# Patient Record
Sex: Female | Born: 1935 | Race: Black or African American | Hispanic: No | Marital: Married | Smoking: Never smoker
Health system: Southern US, Community
[De-identification: ages and names within clinical notes are randomized; demographics above are authoritative.]

## PROBLEM LIST (undated history)

## (undated) DIAGNOSIS — B182 Chronic viral hepatitis C: Secondary | ICD-10-CM

## (undated) DIAGNOSIS — E7800 Pure hypercholesterolemia, unspecified: Secondary | ICD-10-CM

## (undated) DIAGNOSIS — I10 Essential (primary) hypertension: Secondary | ICD-10-CM

## (undated) DIAGNOSIS — I509 Heart failure, unspecified: Secondary | ICD-10-CM

## (undated) HISTORY — DX: Heart failure, unspecified: I50.9

## (undated) HISTORY — PX: KNEE SURGERY: SHX244

## (undated) HISTORY — PX: HYSTEROTOMY: SHX1776

---

## 1998-05-10 ENCOUNTER — Ambulatory Visit (HOSPITAL_COMMUNITY): Admission: RE | Admit: 1998-05-10 | Discharge: 1998-05-10 | Payer: Self-pay | Admitting: *Deleted

## 1998-08-01 ENCOUNTER — Ambulatory Visit (HOSPITAL_COMMUNITY): Admission: RE | Admit: 1998-08-01 | Discharge: 1998-08-01 | Payer: Self-pay | Admitting: *Deleted

## 1998-08-01 ENCOUNTER — Encounter: Payer: Self-pay | Admitting: *Deleted

## 1999-01-31 ENCOUNTER — Ambulatory Visit (HOSPITAL_COMMUNITY): Admission: RE | Admit: 1999-01-31 | Discharge: 1999-01-31 | Payer: Self-pay | Admitting: *Deleted

## 1999-02-04 ENCOUNTER — Other Ambulatory Visit: Admission: RE | Admit: 1999-02-04 | Discharge: 1999-02-04 | Payer: Self-pay | Admitting: Obstetrics

## 1999-11-26 ENCOUNTER — Other Ambulatory Visit: Admission: RE | Admit: 1999-11-26 | Discharge: 1999-11-26 | Payer: Self-pay | Admitting: Obstetrics

## 2001-01-18 ENCOUNTER — Encounter (HOSPITAL_BASED_OUTPATIENT_CLINIC_OR_DEPARTMENT_OTHER): Payer: Self-pay | Admitting: Internal Medicine

## 2001-01-18 ENCOUNTER — Ambulatory Visit (HOSPITAL_COMMUNITY): Admission: RE | Admit: 2001-01-18 | Discharge: 2001-01-18 | Payer: Self-pay | Admitting: Internal Medicine

## 2001-01-21 ENCOUNTER — Encounter (HOSPITAL_BASED_OUTPATIENT_CLINIC_OR_DEPARTMENT_OTHER): Payer: Self-pay | Admitting: Internal Medicine

## 2001-01-21 ENCOUNTER — Encounter: Admission: RE | Admit: 2001-01-21 | Discharge: 2001-01-21 | Payer: Self-pay | Admitting: Internal Medicine

## 2001-03-29 ENCOUNTER — Encounter: Admission: RE | Admit: 2001-03-29 | Discharge: 2001-03-29 | Payer: Self-pay | Admitting: *Deleted

## 2001-03-29 ENCOUNTER — Encounter: Payer: Self-pay | Admitting: *Deleted

## 2001-11-22 ENCOUNTER — Encounter (HOSPITAL_BASED_OUTPATIENT_CLINIC_OR_DEPARTMENT_OTHER): Payer: Self-pay | Admitting: Internal Medicine

## 2001-11-22 ENCOUNTER — Encounter: Admission: RE | Admit: 2001-11-22 | Discharge: 2001-11-22 | Payer: Self-pay | Admitting: Internal Medicine

## 2002-01-24 ENCOUNTER — Ambulatory Visit (HOSPITAL_COMMUNITY): Admission: RE | Admit: 2002-01-24 | Discharge: 2002-01-24 | Payer: Self-pay | Admitting: Internal Medicine

## 2002-01-24 ENCOUNTER — Encounter (HOSPITAL_BASED_OUTPATIENT_CLINIC_OR_DEPARTMENT_OTHER): Payer: Self-pay | Admitting: Internal Medicine

## 2003-01-29 ENCOUNTER — Ambulatory Visit (HOSPITAL_COMMUNITY): Admission: RE | Admit: 2003-01-29 | Discharge: 2003-01-29 | Payer: Self-pay | Admitting: Internal Medicine

## 2003-01-29 ENCOUNTER — Encounter (HOSPITAL_BASED_OUTPATIENT_CLINIC_OR_DEPARTMENT_OTHER): Payer: Self-pay | Admitting: Internal Medicine

## 2004-01-30 ENCOUNTER — Ambulatory Visit (HOSPITAL_COMMUNITY): Admission: RE | Admit: 2004-01-30 | Discharge: 2004-01-30 | Payer: Self-pay | Admitting: Internal Medicine

## 2004-08-29 ENCOUNTER — Ambulatory Visit (HOSPITAL_COMMUNITY): Admission: RE | Admit: 2004-08-29 | Discharge: 2004-08-29 | Payer: Self-pay | Admitting: General Surgery

## 2004-08-29 ENCOUNTER — Ambulatory Visit (HOSPITAL_BASED_OUTPATIENT_CLINIC_OR_DEPARTMENT_OTHER): Admission: RE | Admit: 2004-08-29 | Discharge: 2004-08-29 | Payer: Self-pay | Admitting: General Surgery

## 2004-08-29 ENCOUNTER — Encounter (INDEPENDENT_AMBULATORY_CARE_PROVIDER_SITE_OTHER): Payer: Self-pay | Admitting: *Deleted

## 2005-01-30 ENCOUNTER — Ambulatory Visit (HOSPITAL_COMMUNITY): Admission: RE | Admit: 2005-01-30 | Discharge: 2005-01-30 | Payer: Self-pay | Admitting: Internal Medicine

## 2005-08-20 ENCOUNTER — Encounter: Admission: RE | Admit: 2005-08-20 | Discharge: 2005-08-20 | Payer: Self-pay | Admitting: Gastroenterology

## 2006-02-01 ENCOUNTER — Ambulatory Visit (HOSPITAL_COMMUNITY): Admission: RE | Admit: 2006-02-01 | Discharge: 2006-02-01 | Payer: Self-pay | Admitting: Internal Medicine

## 2006-07-30 ENCOUNTER — Ambulatory Visit (HOSPITAL_COMMUNITY): Admission: RE | Admit: 2006-07-30 | Discharge: 2006-07-30 | Payer: Self-pay | Admitting: Gastroenterology

## 2007-02-03 ENCOUNTER — Ambulatory Visit (HOSPITAL_COMMUNITY): Admission: RE | Admit: 2007-02-03 | Discharge: 2007-02-03 | Payer: Self-pay | Admitting: Internal Medicine

## 2008-02-07 ENCOUNTER — Ambulatory Visit (HOSPITAL_COMMUNITY): Admission: RE | Admit: 2008-02-07 | Discharge: 2008-02-07 | Payer: Self-pay | Admitting: Internal Medicine

## 2009-02-07 ENCOUNTER — Ambulatory Visit (HOSPITAL_COMMUNITY): Admission: RE | Admit: 2009-02-07 | Discharge: 2009-02-07 | Payer: Self-pay | Admitting: Internal Medicine

## 2010-02-10 ENCOUNTER — Ambulatory Visit (HOSPITAL_COMMUNITY): Admission: RE | Admit: 2010-02-10 | Discharge: 2010-02-10 | Payer: Self-pay | Admitting: Internal Medicine

## 2011-01-07 ENCOUNTER — Other Ambulatory Visit (HOSPITAL_COMMUNITY): Payer: Self-pay | Admitting: Surgery

## 2011-01-07 ENCOUNTER — Other Ambulatory Visit: Payer: Self-pay | Admitting: Gastroenterology

## 2011-01-07 DIAGNOSIS — Z1231 Encounter for screening mammogram for malignant neoplasm of breast: Secondary | ICD-10-CM

## 2011-02-06 NOTE — Op Note (Signed)
NAMEJATORIA, Caroline Ortiz            ACCOUNT NO.:  [id number]   MEDICAL RECORD NO.:  [id number]          PATIENT TYPE:  AMB   LOCATION:  DSC                          FACILITY:  [facility]   PHYSICIAN:  Anselm Pancoast. Weatherly, M.D.DATE OF BIRTH:  [birth year]   DATE OF PROCEDURE:  DATE OF DISCHARGE:                                 OPERATIVE REPORT   DATE OF OPERATION:  August 29, 2004.   PRE-PROCEDURE DIAGNOSIS:  Large lipoma, left anterior thigh.   POST-PROCEDURE DIAGNOSIS:  Large lipoma, left anterior thigh.   OPERATION:  Incision of lipoma, and it is about five inches or more in  greatest diameter.   ANESTHESIA:  Local anesthesia, minor surgery room.   HISTORY:  [name] is a 77 year old female, who has had a growth in  the left anterior thigh all her life as far as she can remember that has  gradually increased in size so it is now noticeable through her dress.  Her  children advised her to get it removed, and I saw her in the office on  Tuesday.  They desire to do it this week if possible, and I think we can do  it with a local anesthesia with minimal discomfort since it is kind of  easily accessible and appears not to be firmly attached to the surrounding  fatty tissue.   Positioned on the OR table and a Betadine surgical prep, and then the area  where the incision would be was infiltrated with 1% Xylocaine with  adrenaline, and a kind of a field block was used in the lateral aspect where  the nerves come in, all total about 20 mL used.  An incision approximately 2-  1/2 to 3 inches in length was made.  The underlying lipoma was identified,  and this was separated from the surrounding tissue.  The little pedicles  were coming in superiorly and inferiorly, and these were clamped with  hemostats and the sutured with 3-0 chromic after the lipoma was excised.  There was very little subcutaneous tissue over the area, and I just closed  the skin with 3-0 nylon alternating with  simple and mattress sutures.  She  will be released after a short stay.  Antibiotic ointment was placed, and I  instructed her to keep the area dry for about three days, and then we will  see her back in the office next week.  It will probably need to be aspirated  and then plan on removing the sutures the following week.  It will be sent  for routine path exam.  I am sure it is a benign lipoma grossly.       WJW/MEDQ  D:  08/29/2004  T:  08/29/2004  Job:  [id number]

## 2011-02-13 ENCOUNTER — Ambulatory Visit (HOSPITAL_COMMUNITY)
Admission: RE | Admit: 2011-02-13 | Discharge: 2011-02-13 | Disposition: A | Discharge status: Home / Self Care | Payer: Medicare Other | Source: Ambulatory Visit | Attending: Surgery | Admitting: Surgery

## 2011-02-13 DIAGNOSIS — Z1231 Encounter for screening mammogram for malignant neoplasm of breast: Secondary | ICD-10-CM | POA: Insufficient documentation

## 2011-05-24 ENCOUNTER — Inpatient Hospital Stay (INDEPENDENT_AMBULATORY_CARE_PROVIDER_SITE_OTHER)
Admission: RE | Admit: 2011-05-24 | Discharge: 2011-05-24 | Disposition: A | Discharge status: Home / Self Care | Payer: Medicare Other | Source: Ambulatory Visit | Attending: Family Medicine | Admitting: Family Medicine

## 2011-05-24 DIAGNOSIS — T148 Other injury of unspecified body region: Secondary | ICD-10-CM

## 2011-09-02 ENCOUNTER — Other Ambulatory Visit: Payer: Self-pay | Admitting: Internal Medicine

## 2011-09-02 DIAGNOSIS — B182 Chronic viral hepatitis C: Secondary | ICD-10-CM

## 2011-09-04 ENCOUNTER — Ambulatory Visit
Admission: RE | Admit: 2011-09-04 | Discharge: 2011-09-04 | Disposition: A | Discharge status: Home / Self Care | Payer: Medicare Other | Source: Ambulatory Visit | Attending: Internal Medicine | Admitting: Internal Medicine

## 2011-09-04 DIAGNOSIS — B182 Chronic viral hepatitis C: Secondary | ICD-10-CM

## 2011-09-24 ENCOUNTER — Other Ambulatory Visit: Payer: Self-pay | Admitting: Gastroenterology

## 2012-01-27 ENCOUNTER — Other Ambulatory Visit (HOSPITAL_COMMUNITY): Payer: Self-pay | Admitting: Internal Medicine

## 2012-01-27 DIAGNOSIS — Z1231 Encounter for screening mammogram for malignant neoplasm of breast: Secondary | ICD-10-CM

## 2012-02-23 ENCOUNTER — Ambulatory Visit (HOSPITAL_COMMUNITY)
Admission: RE | Admit: 2012-02-23 | Discharge: 2012-02-23 | Disposition: A | Discharge status: Home / Self Care | Payer: Medicare Other | Source: Ambulatory Visit | Attending: Internal Medicine | Admitting: Internal Medicine

## 2012-02-23 DIAGNOSIS — Z1231 Encounter for screening mammogram for malignant neoplasm of breast: Secondary | ICD-10-CM | POA: Insufficient documentation

## 2012-10-07 ENCOUNTER — Other Ambulatory Visit: Payer: Self-pay | Admitting: Internal Medicine

## 2012-10-07 DIAGNOSIS — B182 Chronic viral hepatitis C: Secondary | ICD-10-CM

## 2012-10-10 ENCOUNTER — Ambulatory Visit (HOSPITAL_COMMUNITY)
Admission: RE | Admit: 2012-10-10 | Discharge: 2012-10-10 | Disposition: A | Discharge status: Home / Self Care | Payer: Medicare Other | Source: Ambulatory Visit | Attending: Internal Medicine | Admitting: Internal Medicine

## 2012-10-10 ENCOUNTER — Other Ambulatory Visit: Payer: Self-pay | Admitting: Internal Medicine

## 2012-10-10 DIAGNOSIS — B182 Chronic viral hepatitis C: Secondary | ICD-10-CM | POA: Insufficient documentation

## 2013-01-25 ENCOUNTER — Other Ambulatory Visit (HOSPITAL_COMMUNITY): Payer: Self-pay | Admitting: Internal Medicine

## 2013-01-25 DIAGNOSIS — Z1231 Encounter for screening mammogram for malignant neoplasm of breast: Secondary | ICD-10-CM

## 2013-02-23 ENCOUNTER — Ambulatory Visit (HOSPITAL_COMMUNITY)
Admission: RE | Admit: 2013-02-23 | Discharge: 2013-02-23 | Disposition: A | Discharge status: Home / Self Care | Payer: Medicare Other | Source: Ambulatory Visit | Attending: Internal Medicine | Admitting: Internal Medicine

## 2013-02-23 DIAGNOSIS — Z1231 Encounter for screening mammogram for malignant neoplasm of breast: Secondary | ICD-10-CM | POA: Insufficient documentation

## 2014-01-30 ENCOUNTER — Other Ambulatory Visit (HOSPITAL_COMMUNITY): Payer: Self-pay | Admitting: Internal Medicine

## 2014-01-30 DIAGNOSIS — Z1231 Encounter for screening mammogram for malignant neoplasm of breast: Secondary | ICD-10-CM

## 2014-02-27 ENCOUNTER — Ambulatory Visit (HOSPITAL_COMMUNITY)
Admission: RE | Admit: 2014-02-27 | Discharge: 2014-02-27 | Disposition: A | Discharge status: Home / Self Care | Payer: Medicare Other | Source: Ambulatory Visit | Attending: Internal Medicine | Admitting: Internal Medicine

## 2014-02-27 DIAGNOSIS — Z1231 Encounter for screening mammogram for malignant neoplasm of breast: Secondary | ICD-10-CM | POA: Insufficient documentation

## 2014-04-29 ENCOUNTER — Emergency Department (HOSPITAL_COMMUNITY)
Admission: EM | Admit: 2014-04-29 | Discharge: 2014-04-29 | Disposition: A | Discharge status: Home / Self Care | Payer: Medicare Other | Attending: Emergency Medicine | Admitting: Emergency Medicine

## 2014-04-29 ENCOUNTER — Emergency Department (HOSPITAL_COMMUNITY): Payer: Medicare Other

## 2014-04-29 ENCOUNTER — Encounter (HOSPITAL_COMMUNITY): Payer: Self-pay | Admitting: Emergency Medicine

## 2014-04-29 DIAGNOSIS — E785 Hyperlipidemia, unspecified: Secondary | ICD-10-CM | POA: Diagnosis not present

## 2014-04-29 DIAGNOSIS — N200 Calculus of kidney: Secondary | ICD-10-CM | POA: Diagnosis not present

## 2014-04-29 DIAGNOSIS — Z880 Allergy status to penicillin: Secondary | ICD-10-CM | POA: Insufficient documentation

## 2014-04-29 DIAGNOSIS — Z79899 Other long term (current) drug therapy: Secondary | ICD-10-CM | POA: Insufficient documentation

## 2014-04-29 DIAGNOSIS — R1032 Left lower quadrant pain: Secondary | ICD-10-CM | POA: Diagnosis present

## 2014-04-29 DIAGNOSIS — IMO0002 Reserved for concepts with insufficient information to code with codable children: Secondary | ICD-10-CM | POA: Diagnosis not present

## 2014-04-29 DIAGNOSIS — I10 Essential (primary) hypertension: Secondary | ICD-10-CM | POA: Diagnosis not present

## 2014-04-29 DIAGNOSIS — D696 Thrombocytopenia, unspecified: Secondary | ICD-10-CM | POA: Diagnosis not present

## 2014-04-29 DIAGNOSIS — R112 Nausea with vomiting, unspecified: Secondary | ICD-10-CM | POA: Insufficient documentation

## 2014-04-29 DIAGNOSIS — Z8619 Personal history of other infectious and parasitic diseases: Secondary | ICD-10-CM | POA: Insufficient documentation

## 2014-04-29 HISTORY — DX: Chronic viral hepatitis C: B18.2

## 2014-04-29 HISTORY — DX: Pure hypercholesterolemia, unspecified: E78.00

## 2014-04-29 HISTORY — DX: Essential (primary) hypertension: I10

## 2014-04-29 LAB — CBC WITH DIFFERENTIAL/PLATELET
BASOS ABS: 0 10*3/uL (ref 0.0–0.1)
Basophils Relative: 0 % (ref 0–1)
EOS ABS: 0 10*3/uL (ref 0.0–0.7)
EOS PCT: 0 % (ref 0–5)
HCT: 39.9 % (ref 36.0–46.0)
HEMOGLOBIN: 13.3 g/dL (ref 12.0–15.0)
Lymphocytes Relative: 36 % (ref 12–46)
Lymphs Abs: 1.1 10*3/uL (ref 0.7–4.0)
MCH: 33.3 pg (ref 26.0–34.0)
MCHC: 33.3 g/dL (ref 30.0–36.0)
MCV: 100 fL (ref 78.0–100.0)
MONO ABS: 0.3 10*3/uL (ref 0.1–1.0)
MONOS PCT: 10 % (ref 3–12)
NEUTROS PCT: 53 % (ref 43–77)
Neutro Abs: 1.6 10*3/uL — ABNORMAL LOW (ref 1.7–7.7)
PLATELETS: 65 10*3/uL — AB (ref 150–400)
RBC: 3.99 MIL/uL (ref 3.87–5.11)
RDW: 13.1 % (ref 11.5–15.5)
WBC: 3.1 10*3/uL — ABNORMAL LOW (ref 4.0–10.5)

## 2014-04-29 LAB — URINALYSIS, ROUTINE W REFLEX MICROSCOPIC
Bilirubin Urine: NEGATIVE
GLUCOSE, UA: NEGATIVE mg/dL
KETONES UR: NEGATIVE mg/dL
Leukocytes, UA: NEGATIVE
Nitrite: NEGATIVE
PH: 7 (ref 5.0–8.0)
Protein, ur: NEGATIVE mg/dL
Specific Gravity, Urine: 1.015 (ref 1.005–1.030)
Urobilinogen, UA: 1 mg/dL (ref 0.0–1.0)

## 2014-04-29 LAB — URINE MICROSCOPIC-ADD ON

## 2014-04-29 LAB — I-STAT CHEM 8, ED
BUN: 16 mg/dL (ref 6–23)
CALCIUM ION: 1.16 mmol/L (ref 1.13–1.30)
CHLORIDE: 102 meq/L (ref 96–112)
CREATININE: 0.8 mg/dL (ref 0.50–1.10)
Glucose, Bld: 143 mg/dL — ABNORMAL HIGH (ref 70–99)
HEMATOCRIT: 42 % (ref 36.0–46.0)
Hemoglobin: 14.3 g/dL (ref 12.0–15.0)
Potassium: 3.4 mEq/L — ABNORMAL LOW (ref 3.7–5.3)
SODIUM: 141 meq/L (ref 137–147)
TCO2: 26 mmol/L (ref 0–100)

## 2014-04-29 MED ORDER — IOHEXOL 300 MG/ML  SOLN
25.0000 mL | Freq: Once | INTRAMUSCULAR | Status: AC | PRN
  Administered 2014-04-29: 25 mL via ORAL

## 2014-04-29 MED ORDER — IOHEXOL 300 MG/ML  SOLN
100.0000 mL | Freq: Once | INTRAMUSCULAR | Status: AC | PRN
  Administered 2014-04-29: 100 mL via INTRAVENOUS

## 2014-04-29 MED ORDER — NAPROXEN 500 MG PO TABS: 500.0000 mg | ORAL_TABLET | Freq: Two times a day (BID) | ORAL | Status: DC

## 2014-04-29 MED ORDER — HYDROCODONE-ACETAMINOPHEN 5-325 MG PO TABS: 2.0000 | ORAL_TABLET | ORAL | Status: DC | PRN

## 2014-04-29 MED ORDER — MORPHINE SULFATE 4 MG/ML IJ SOLN
2.0000 mg | Freq: Once | INTRAMUSCULAR | Status: AC
  Administered 2014-04-29: 2 mg via INTRAVENOUS
  Filled 2014-04-29: qty 1

## 2014-04-29 MED ORDER — ONDANSETRON HCL 4 MG/2ML IJ SOLN
4.0000 mg | Freq: Once | INTRAMUSCULAR | Status: AC
  Administered 2014-04-29: 4 mg via INTRAVENOUS
  Filled 2014-04-29: qty 2

## 2014-04-29 NOTE — ED Notes (Signed)
Pt brought to ED by [facility] from home c/o abd pain 10/10 that radiates to the groin on the left side, pain increases when pt make urine. Pt having some nausea and vomiting on EMS arrival. Zofran IV given by EMS.

## 2014-04-29 NOTE — ED Provider Notes (Signed)
CSN: [id number]     Arrival date & time 04/29/14  0236 History   First MD Initiated Contact with Patient 04/29/14 [phone]     Chief Complaint  Patient presents with  . Abdominal Pain  . Nausea  . Emesis     (Consider location/radiation/quality/duration/timing/severity/associated sxs/prior Treatment) HPI Comments: 80 year old female, history of hypertension and high cholesterol as well as a history of a hysterectomy in the past who presents with a complaint of left lower quadrant pain. This started this evening, it is intermittent, worse with trying to urinate, no diarrhea or constipation. She did have some nausea and vomiting when the paramedics arrived, they gave Zofran. She states that this pain is severe at its worst, it is intermittent, it does radiate to her left flank and groin.  Patient is a 80 y.o. female presenting with abdominal pain and vomiting. The history is provided by the patient.  Abdominal Pain Associated symptoms: vomiting   Emesis Associated symptoms: abdominal pain     Past Medical History  Diagnosis Date  . Hypertension   . High cholesterol   . Hep C w/ coma, chronic    History reviewed. No pertinent past surgical history. History reviewed. No pertinent family history. History  Substance Use Topics  . Smoking status: Never Smoker   . Smokeless tobacco: Never Used  . Alcohol Use: No   OB History   Grav Para Term Preterm Abortions TAB SAB Ect Mult Living                 Review of Systems  Gastrointestinal: Positive for vomiting and abdominal pain.  All other systems reviewed and are negative.     Allergies  Penicillins  Home Medications   Prior to Admission medications   Medication Sig Start Date End Date Taking? Authorizing Provider  amLODipine (NORVASC) 2.5 MG tablet Take 2.5 mg by mouth daily.   Yes Historical Provider, MD  calcium carbonate (OS-CAL) 600 MG TABS tablet Take 600 mg by mouth daily with breakfast.   Yes Historical Provider, MD   cholecalciferol (VITAMIN D) 1000 UNITS tablet Take 1,000 Units by mouth daily.   Yes Historical Provider, MD  gabapentin (NEURONTIN) 100 MG capsule Take 100 mg by mouth 2 (two) times daily.    Yes Historical Provider, MD  lisinopril-hydrochlorothiazide (PRINZIDE,ZESTORETIC) 10-12.5 MG per tablet Take 1 tablet by mouth daily.   Yes Historical Provider, MD  metoprolol (LOPRESSOR) 50 MG tablet Take 50 mg by mouth daily.   Yes Historical Provider, MD  PRESCRIPTION MEDICATION Take 1 tablet by mouth daily as needed (anxiety). Anxiety medication but did not have the bottle with her   Yes Historical Provider, MD  HYDROcodone-acetaminophen (NORCO/VICODIN) 5-325 MG per tablet Take 2 tablets by mouth every 4 (four) hours as needed. 04/29/14   Vida Roller, MD  naproxen (NAPROSYN) 500 MG tablet Take 1 tablet (500 mg total) by mouth 2 (two) times daily with a meal. 04/29/14   Vida Roller, MD   BP 118/59  Pulse 65  Temp(Src) 97.8 F (36.6 C) (Oral)  Resp 15  Ht 5\' 4"  (1.626 m)  Wt 134 lb (60.782 kg)  BMI 22.99 kg/m2  SpO2 97% Physical Exam  Nursing note and vitals reviewed. Constitutional: She appears well-developed and well-nourished. No distress.  HENT:  Head: Normocephalic and atraumatic.  Mouth/Throat: Oropharynx is clear and moist. No oropharyngeal exudate.  Eyes: Conjunctivae and EOM are normal. Pupils are equal, round, and reactive to light. Right eye exhibits no discharge.  Left eye exhibits no discharge. No scleral icterus.  Neck: Normal range of motion. Neck supple. No JVD present. No thyromegaly present.  Cardiovascular: Normal rate, regular rhythm, normal heart sounds and intact distal pulses.  Exam reveals no gallop and no friction rub.   No murmur heard. Pulmonary/Chest: Effort normal and breath sounds normal. No respiratory distress. She has no wheezes. She has no rales.  Abdominal: Soft. Bowel sounds are normal. She exhibits no distension and no mass. There is tenderness (  Reproducible left lower quadrant tenderness to palpation, no pain at McBurney's point, no Murphy sign).  No guarding, no distention, normal bowel sounds  Musculoskeletal: Normal range of motion. She exhibits no edema and no tenderness.  Lymphadenopathy:    She has no cervical adenopathy.  Neurological: She is alert. Coordination normal.  Skin: Skin is warm and dry. No rash noted. No erythema.  Psychiatric: She has a normal mood and affect. Her behavior is normal.    ED Course  Procedures (including critical care time) Labs Review Labs Reviewed  URINALYSIS, ROUTINE W REFLEX MICROSCOPIC - Abnormal; Notable for the following:    Hgb urine dipstick MODERATE (*)    All other components within normal limits  CBC WITH DIFFERENTIAL - Abnormal; Notable for the following:    WBC 3.1 (*)    Platelets 65 (*)    Neutro Abs 1.6 (*)    All other components within normal limits  URINE MICROSCOPIC-ADD ON - Abnormal; Notable for the following:    Squamous Epithelial / LPF FEW (*)    Bacteria, UA FEW (*)    All other components within normal limits  I-STAT CHEM 8, ED - Abnormal; Notable for the following:    Potassium 3.4 (*)    Glucose, Bld 143 (*)    All other components within normal limits  URINE CULTURE    Imaging Review Ct Abdomen Pelvis W Contrast  04/29/2014   CLINICAL DATA:  Abdominal pain, radiating to the left groin. Dysuria. Nausea and vomiting.  EXAM: CT ABDOMEN AND PELVIS WITH CONTRAST  TECHNIQUE: Multidetector CT imaging of the abdomen and pelvis was performed using the standard protocol following bolus administration of intravenous contrast.  CONTRAST:  OMNIPAQUE IOHEXOL 300 MG/ML  SOLN  COMPARISON:  None.  FINDINGS: Mild bibasilar atelectasis or scarring is noted. Dense calcification is noted at the mitral valve. A tiny hiatal hernia is seen.  The liver and spleen are unremarkable in appearance. The gallbladder is within normal limits. The pancreas and adrenal glands are  unremarkable.  There is minimal left-sided hydronephrosis, with mild prominence of the left ureter along its entire course. Mild wall thickening along the left ureter may reflect mild ureteritis. No definite distal obstructing stone is seen; this may reflect a recently passed stone.  The right kidney is unremarkable in appearance. A 2.1 cm cyst is noted near the upper pole of the left kidney; smaller bilateral renal cysts are seen. No significant perinephric stranding is appreciated. No nonobstructing renal stones are identified.  No free fluid is identified. The small bowel is unremarkable in appearance. The stomach is within normal limits. No acute vascular abnormalities are seen.  The appendix is not typically seen; there is no evidence for appendicitis. There is mild redundancy of the transverse colon. Scattered diverticulosis is noted along the transverse, descending and proximal sigmoid colon, without evidence of diverticulitis. The sigmoid colon is relatively decompressed and difficult to fully assess.  The bladder is moderately distended and grossly unremarkable. The  patient is status post hysterectomy. No suspicious adnexal masses are seen. The ovaries are relatively symmetric. No inguinal lymphadenopathy is seen.  No acute osseous abnormalities are identified. There is grade 1 anterolisthesis of L4 on L5, reflecting underlying facet disease. Vacuum phenomenon is noted at multiple levels along the lumbar spine.  IMPRESSION: 1. Minimal left-sided hydronephrosis, with mild diffuse prominence of the left ureter. Mild wall thickening along the left ureter may reflect mild ureteritis. No definite distal obstructing stone seen; this may reflect a recently passed stone. No evidence of pyelonephritis at this time. 2. Scattered small bilateral renal cysts. 3. Scattered diverticulosis along the transverse, descending and proximal sigmoid colon, without evidence of diverticulitis. 4. Dense calcification of the mitral  valve. 5. Tiny hiatal hernia seen. 6. Mild atelectasis or scarring noted at the lung bases.   Electronically Signed   By: Roanna RaiderJeffery  Chang M.D.   On: 04/29/2014 05:48      MDM   Final diagnoses:  Kidney stone on left side  Thrombocytopenia    The patient appears to have focal left lower quadrant pain, dysuria, consider urinary infection, would also consider diverticulitis or other intra-abdominal source it urinalysis not consistent with infection.  Urinalysis shows that the patient has hematuria, CT scan confirms ureteral dilation consistent with a recently passed kidney stone. The patient has received medication and has been asymptomatic for the majority of her stay, will be discharged home in an improved condition, family informed of the diagnosis. In addition the patient is thrombocytopenic, I do not laboratory data with which to compare. Family informed of this as well    Vida RollerBrian D Aliese Brannum, MD 04/29/14 [phone]

## 2014-04-29 NOTE — Discharge Instructions (Signed)
Your exam and or your xrays have shown that you likely have a kidney stone.  You should follow up with the Urologist of your choosing or the Urologist listed above in the next 2-3 days if you have not passed the stone.  You should urinate in to the strainer until you pass the stone.    Flomax helps with passing the stone by opening up the Ureters (tubes), Vicodin and an antiinflammatory for pain if you are not allergic to these medicines.  Phenergan or Zofran for nausea.  Return to the ER for severe or worsening pain, vomiting or fevers or if you are unable to control your pain with the medicines provided.  Kidney Stones Kidney stones (ureteral lithiasis) are deposits that form inside your kidneys. The intense pain is caused by the stone moving through the urinary tract. When the stone moves, the ureter goes into spasm around the stone. The stone is usually passed in the urine.  CAUSES  A disorder that makes certain neck glands produce too much parathyroid hormone (primary hyperparathyroidism).  A buildup of uric acid crystals.  Narrowing (stricture) of the ureter.  A kidney obstruction present at birth (congenital obstruction).  Previous surgery on the kidney or ureters.  Numerous kidney infections.  SYMPTOMS  Feeling sick to your stomach (nauseous).  Throwing up (vomiting).  Blood in the urine (hematuria).  Pain that usually spreads (radiates) to the groin.  Frequency or urgency of urination.  DIAGNOSIS  Taking a history and physical exam.  Blood or urine tests.  Computerized X-ray scan (CT scan).  Occasionally, an examination of the inside of the urinary bladder (cystoscopy) is performed.  TREATMENT  Observation.  Increasing your fluid intake.  Surgery may be needed if you have severe pain or persistent obstruction.  The size, location, and chemical composition are all important variables that will determine the proper choice of action for you. Talk to your caregiver to better  understand your situation so that you will minimize the risk of injury to yourself and your kidney.  HOME CARE INSTRUCTIONS  Drink enough water and fluids to keep your urine clear or pale yellow.  Strain all urine through the provided strainer. Keep all particulate matter and stones for your caregiver to see. The stone causing the pain may be as small as a grain of salt. It is very important to use the strainer each and every time you pass your urine. The collection of your stone will allow your caregiver to analyze it and verify that a stone has actually passed.  Only take over-the-counter or prescription medicines for pain, discomfort, or fever as directed by your caregiver.  Make a follow-up appointment with your caregiver as directed.  Get follow-up X-rays if required. The absence of pain does not always mean that the stone has passed. It may have only stopped moving. If the urine remains completely obstructed, it can cause loss of kidney function or even complete destruction of the kidney. It is your responsibility to make sure X-rays and follow-ups are completed. Ultrasounds of the kidney can show blockages and the status of the kidney. Ultrasounds are not associated with any radiation and can be performed easily in a matter of minutes.  SEEK IMMEDIATE MEDICAL CARE IF:  Pain cannot be controlled with the prescribed medicine.  You have a fever.  The severity or intensity of pain increases over 18 hours and is not relieved by pain medicine.  You develop a new onset of abdominal pain.  You   feel faint or pass out.  MAKE SURE YOU:  Understand these instructions.  Will watch your condition.  Will get help right away if you are not doing well or get worse.  Document Released: 09/07/2005 Document Revised: 08/27/2011 Document Reviewed: 01/03/2010 ExitCare Patient Information 2012 [employer]  RESOURCE GUIDE  Chronic Pain Problems: Contact [facility]  [phone] Patients  need to be referred by their primary care doctor.  Insufficient Money for Medicine: Contact United Way:  call "211" or [facility] 271-5999.  No Primary Care Doctor: Call [facility]  832-8000 - can help you locate a primary care doctor that  accepts your insurance, provides certain services, etc. Physician Referral Service- 1-800-533-3463  Agencies that provide inexpensive medical care: [facility]  [phone] [facility]  [phone] [facility]  [phone] [facility]  [phone] [facility]  [phone] [facility]  [phone]  Medicaid-accepting [place] Providers: [facility]- [address]  [phone], Mon-Fri 9am-7pm, Sat 9am-1pm [facility]- [address], [address]  [phone] [facility]- [address], [address]  [phone] [facility]- [address] [place] [address]  [phone] [facility]- [address], [address], [phone]  Only accepts [place] Access Medicaid patients after they have their name  applied to their card  Self Pay (no insurance) in [place]: Sickle Cell Patients: Dr Eric Dean, [facility]  [address], [phone] [facility]- [address]  [phone]       -     [facility]- [address]       -     [facility]- see information above (Speak to Pam H if you do not have insurance)       -  [facility]- [address], [phone]       -  [facility] [place]- [address],  [phone]       -  [facility]- [address] [place] [address], [phone]       -  Dr Osei-Bonsu-  [address], [place], [phone]       -  [facility]- [address], [phone]       -  [facility]- [address] [place] [address], [phone], also [address], [phone]        -    [facility]- [address], [phone], 1st & 3rd Saturday   every month, 10am-1pm  1) Find a Doctor and Pay Out of Pocket Although you won't have to find out who is covered by your insurance plan, it is a good idea to ask around and get recommendations. You will then need to call the office and see if the doctor you have chosen will accept you as a new patient and what types of options they offer for patients who are self-pay. Some doctors offer discounts or will set up payment plans for their patients who do not have insurance, but you will need to ask so you aren't surprised when you get to your appointment.  2) Contact Your Local Health Department Not all health departments have doctors that can see patients for sick visits, but many do, so it is worth a call to see if yours does. If   you don't know where your local health department is, you can check in your phone book. The CDC also has a tool to help you locate your state's health department, and many state websites also have listings of all of their local health departments.  3) Find a Walk-in Clinic If your illness is not likely to be very severe or complicated, you may want to try a walk in clinic. These are popping up all over the country in pharmacies, drugstores, and shopping centers. They're usually staffed by nurse practitioners or physician assistants that have been trained to treat common illnesses and complaints. They're usually fairly quick and inexpensive. However, if you have serious medical issues or chronic medical problems, these are probably not your best option  STD Testing [place] [facility] [place], [facility] Clinic, [address], [place], phone [phone] or [phone].  Monday - Friday, call for an appointment. [place] [facility] [place], [facility] Clinic, [address], [place], phone [phone] or [phone].  Monday - Friday, call for an  appointment.  Abuse/Neglect: [place] Child Abuse Hotline (336) 641-3795 [place] Child Abuse Hotline 800-378-5315 (After Hours)  Emergency Shelter:  [facility] (336) 271-5985  Maternity Homes: [facility] (336) 275-9566 [facility] (704) 372-4663  MRSA Hotline #:   832-7006  [place] Resources  [facility] [place]  [employer] [place] [facility] [address]                 [address]         [address]  [place]                                               [place]                              [place] Phone:  [phone]                                  Phone:  [phone]                   Phone:  [phone]  [place] [facility], 342-8316 [place] Services - [facility]- 1-888-581-9988       -     [facility] in [place], [address],                                  336-349-4454, Insurance  [place] Child Abuse Hotline (336) 342-1394 or (336) 342-3537 (After Hours)   Behavioral Health Services  Substance Abuse Resources: [facility]  336-882-2125 [facility] 336-784-9470 [facility] 336-285-9073 [facility] 336-845-3988 Residential & Outpatient Substance Abuse Program  800-659-3381  Psychological Services: [facility]  832-9600 [facility]  [phone] [facility], [address], [place], ACCESS LINE: 1-800-853-5163 or 336-641-4981, Http://www.[facility].com/services/adult.htm  Dental Assistance  If unable to pay or uninsured, contact:  [facility] or [place] [facility] to become qualified for the adult dental clinic.  Patients   with Medicaid: [facility] [address], [phone], [phone]  If unable to pay, or uninsured, contact  [facility] (271-5999) or [place] [facility] ([phone] in [place], [phone] in [place]) to become qualified for the adult dental clinic  Other Low-Cost Community Dental Services: [facility]- [address], [place], [place], [address], [phone], [address]. [address], 2nd and 4th Thursday of the month at 6:30am.  10 clients each day by appointment, can sometimes see walk-in patients if someone does not show for an appointment. [facility]- [address], [place], [place], [address], [phone] [facility]- [address], [place], [place], [address], [phone] [place] [facility]- [phone] [facility]- [phone] [facility]- [phone]      

## 2014-04-30 LAB — URINE CULTURE
COLONY COUNT: NO GROWTH
Culture: NO GROWTH

## 2015-02-04 ENCOUNTER — Other Ambulatory Visit (HOSPITAL_COMMUNITY): Payer: Self-pay | Admitting: Internal Medicine

## 2015-02-04 DIAGNOSIS — Z1231 Encounter for screening mammogram for malignant neoplasm of breast: Secondary | ICD-10-CM

## 2015-03-01 ENCOUNTER — Ambulatory Visit (HOSPITAL_COMMUNITY)
Admission: RE | Admit: 2015-03-01 | Discharge: 2015-03-01 | Disposition: A | Discharge status: Home / Self Care | Payer: Medicare Other | Source: Ambulatory Visit | Attending: Internal Medicine | Admitting: Internal Medicine

## 2015-03-01 DIAGNOSIS — Z1231 Encounter for screening mammogram for malignant neoplasm of breast: Secondary | ICD-10-CM | POA: Diagnosis not present

## 2015-10-24 ENCOUNTER — Other Ambulatory Visit (HOSPITAL_COMMUNITY): Payer: Self-pay | Admitting: *Deleted

## 2015-10-25 ENCOUNTER — Ambulatory Visit (HOSPITAL_COMMUNITY)
Admission: RE | Admit: 2015-10-25 | Discharge: 2015-10-25 | Disposition: A | Discharge status: Home / Self Care | Payer: Medicare Other | Source: Ambulatory Visit | Attending: Internal Medicine | Admitting: Internal Medicine

## 2015-10-25 DIAGNOSIS — M810 Age-related osteoporosis without current pathological fracture: Secondary | ICD-10-CM | POA: Insufficient documentation

## 2015-10-25 MED ORDER — DENOSUMAB 60 MG/ML ~~LOC~~ SOLN
60.0000 mg | Freq: Once | SUBCUTANEOUS | Status: AC
  Administered 2015-10-25: 60 mg via SUBCUTANEOUS
  Filled 2015-10-25: qty 1

## 2016-01-16 ENCOUNTER — Other Ambulatory Visit (HOSPITAL_COMMUNITY): Payer: Self-pay | Admitting: Gastroenterology

## 2016-01-16 DIAGNOSIS — B182 Chronic viral hepatitis C: Secondary | ICD-10-CM

## 2016-01-27 ENCOUNTER — Other Ambulatory Visit: Payer: Self-pay

## 2016-01-27 DIAGNOSIS — Z1231 Encounter for screening mammogram for malignant neoplasm of breast: Secondary | ICD-10-CM

## 2016-02-12 ENCOUNTER — Ambulatory Visit (HOSPITAL_COMMUNITY)
Admission: RE | Admit: 2016-02-12 | Discharge: 2016-02-12 | Disposition: A | Discharge status: Home / Self Care | Payer: Medicare Other | Source: Ambulatory Visit | Attending: Gastroenterology | Admitting: Gastroenterology

## 2016-02-12 DIAGNOSIS — B182 Chronic viral hepatitis C: Secondary | ICD-10-CM

## 2016-02-20 ENCOUNTER — Ambulatory Visit (HOSPITAL_COMMUNITY): Payer: Medicare Other

## 2016-03-03 ENCOUNTER — Ambulatory Visit
Admission: RE | Admit: 2016-03-03 | Discharge: 2016-03-03 | Disposition: A | Discharge status: Home / Self Care | Payer: Medicare Other | Source: Ambulatory Visit

## 2016-03-03 DIAGNOSIS — Z1231 Encounter for screening mammogram for malignant neoplasm of breast: Secondary | ICD-10-CM

## 2016-03-05 ENCOUNTER — Other Ambulatory Visit: Payer: Self-pay | Admitting: Internal Medicine

## 2016-03-05 DIAGNOSIS — R928 Other abnormal and inconclusive findings on diagnostic imaging of breast: Secondary | ICD-10-CM

## 2016-03-16 ENCOUNTER — Ambulatory Visit
Admission: RE | Admit: 2016-03-16 | Discharge: 2016-03-16 | Disposition: A | Discharge status: Home / Self Care | Payer: Medicare Other | Source: Ambulatory Visit | Attending: Internal Medicine | Admitting: Internal Medicine

## 2016-03-16 DIAGNOSIS — R928 Other abnormal and inconclusive findings on diagnostic imaging of breast: Secondary | ICD-10-CM

## 2016-03-17 ENCOUNTER — Ambulatory Visit (HOSPITAL_COMMUNITY): Payer: Medicare Other

## 2016-03-18 ENCOUNTER — Ambulatory Visit (HOSPITAL_COMMUNITY)
Admission: RE | Admit: 2016-03-18 | Discharge: 2016-03-18 | Disposition: A | Discharge status: Home / Self Care | Payer: Medicare Other | Source: Ambulatory Visit | Attending: Gastroenterology | Admitting: Gastroenterology

## 2016-03-18 DIAGNOSIS — N281 Cyst of kidney, acquired: Secondary | ICD-10-CM | POA: Insufficient documentation

## 2016-03-18 DIAGNOSIS — K7460 Unspecified cirrhosis of liver: Secondary | ICD-10-CM | POA: Diagnosis not present

## 2016-03-18 DIAGNOSIS — B182 Chronic viral hepatitis C: Secondary | ICD-10-CM | POA: Diagnosis present

## 2016-04-18 ENCOUNTER — Emergency Department (HOSPITAL_COMMUNITY): Payer: Medicare Other

## 2016-04-18 ENCOUNTER — Emergency Department (HOSPITAL_COMMUNITY)
Admission: EM | Admit: 2016-04-18 | Discharge: 2016-04-18 | Disposition: A | Discharge status: Home / Self Care | Payer: Medicare Other | Attending: Emergency Medicine | Admitting: Emergency Medicine

## 2016-04-18 ENCOUNTER — Encounter (HOSPITAL_COMMUNITY): Payer: Self-pay

## 2016-04-18 DIAGNOSIS — S0083XA Contusion of other part of head, initial encounter: Secondary | ICD-10-CM | POA: Diagnosis not present

## 2016-04-18 DIAGNOSIS — W01198A Fall on same level from slipping, tripping and stumbling with subsequent striking against other object, initial encounter: Secondary | ICD-10-CM | POA: Diagnosis not present

## 2016-04-18 DIAGNOSIS — M25562 Pain in left knee: Secondary | ICD-10-CM | POA: Diagnosis not present

## 2016-04-18 DIAGNOSIS — R51 Headache: Secondary | ICD-10-CM | POA: Diagnosis not present

## 2016-04-18 DIAGNOSIS — I10 Essential (primary) hypertension: Secondary | ICD-10-CM | POA: Diagnosis not present

## 2016-04-18 DIAGNOSIS — Z79899 Other long term (current) drug therapy: Secondary | ICD-10-CM | POA: Insufficient documentation

## 2016-04-18 DIAGNOSIS — M25561 Pain in right knee: Secondary | ICD-10-CM | POA: Insufficient documentation

## 2016-04-18 DIAGNOSIS — W19XXXA Unspecified fall, initial encounter: Secondary | ICD-10-CM

## 2016-04-18 DIAGNOSIS — S299XXA Unspecified injury of thorax, initial encounter: Secondary | ICD-10-CM | POA: Diagnosis not present

## 2016-04-18 DIAGNOSIS — Y999 Unspecified external cause status: Secondary | ICD-10-CM | POA: Insufficient documentation

## 2016-04-18 DIAGNOSIS — S0993XA Unspecified injury of face, initial encounter: Secondary | ICD-10-CM | POA: Diagnosis present

## 2016-04-18 DIAGNOSIS — Y92513 Shop (commercial) as the place of occurrence of the external cause: Secondary | ICD-10-CM | POA: Insufficient documentation

## 2016-04-18 DIAGNOSIS — Y9301 Activity, walking, marching and hiking: Secondary | ICD-10-CM | POA: Diagnosis not present

## 2016-04-18 MED ORDER — ACETAMINOPHEN 325 MG PO TABS
650.0000 mg | ORAL_TABLET | Freq: Once | ORAL | Status: AC
  Administered 2016-04-18: 650 mg via ORAL
  Filled 2016-04-18: qty 2

## 2016-04-18 NOTE — ED Triage Notes (Signed)
Patient fell while walking today at shopping center after stepping into crack of sidewalk. Patient denies loc but complains of bilateral knee and hand pain and right side of face pain. No swelling or abrasions noted

## 2016-04-18 NOTE — ED Provider Notes (Signed)
[facility]EMER[facility]ENCY DEPT Provider Note   CSN: [id number] Arrival date & time: 04/18/16  1117  First Provider Contact:  First MD Initiated Contact with Patient 04/18/16 1454        History   Chief Complaint No chief complaint on file.   HPI Caroline Ortiz is a 82 y.o. female presenting after a trip and fall. She was walking with family and hit an uneven area on the sidewalk and fell forward. Hit her chin and family thinks she might hit her face but patient denies this. She did not lose consciousness. She also hit her hands and knees. Denies any type of headache and heard jaw pain is improving. She feels a little numb around her inferior right lip. Her hands have some mild pain in her kneecaps also hurt a little bit. However she has been walking around in the waiting room while waiting on her room to prevent her from getting stiff. She currently has ice on her knees. No hip pain. She does have some chest wall pain in the middle of her chest. No dyspnea. Family states she has hep c but no cirrhosis. Bruises easily. Is on a baby ASA.  HPI  Past Medical History:  Diagnosis Date  . Hep C w/ coma, chronic (HCC)   . High cholesterol   . Hypertension     There are no active problems to display for this patient.   History reviewed. No pertinent surgical history.  OB History    No data available       Home Medications    Prior to Admission medications   Medication Sig Start Date End Date Taking? Authorizing Provider  amLODipine (NORVASC) 2.5 MG tablet Take 2.5 mg by mouth daily.    Historical Provider, MD  calcium carbonate (OS-CAL) 600 MG TABS tablet Take 600 mg by mouth daily with breakfast.    Historical Provider, MD  cholecalciferol (VITAMIN D) 1000 UNITS tablet Take 1,000 Units by mouth daily.    Historical Provider, MD  gabapentin (NEURONTIN) 100 MG capsule Take 100 mg by mouth 2 (two) times daily.     Historical Provider, MD  HYDROcodone-acetaminophen (NORCO/VICODIN) 5-325 MG  per tablet Take 2 tablets by mouth every 4 (four) hours as needed. 04/29/14   Eber Hong, MD  lisinopril-hydrochlorothiazide (PRINZIDE,ZESTORETIC) 10-12.5 MG per tablet Take 1 tablet by mouth daily.    Historical Provider, MD  metoprolol (LOPRESSOR) 50 MG tablet Take 50 mg by mouth daily.    Historical Provider, MD  naproxen (NAPROSYN) 500 MG tablet Take 1 tablet (500 mg total) by mouth 2 (two) times daily with a meal. 04/29/14   Eber Hong, MD  PRESCRIPTION MEDICATION Take 1 tablet by mouth daily as needed (anxiety). Anxiety medication but did not have the bottle with her    Historical Provider, MD    Family History No family history on file.  Social History Social History  Substance Use Topics  . Smoking status: Never Smoker  . Smokeless tobacco: Never Used  . Alcohol use No     Allergies   Penicillins   Review of Systems Review of Systems  HENT: Positive for facial swelling.   Gastrointestinal: Negative for nausea and vomiting.  Musculoskeletal: Positive for arthralgias and neck stiffness. Negative for joint swelling and neck pain.  Neurological: Negative for dizziness, weakness, numbness and headaches.  All other systems reviewed and are negative.    Physical Exam Updated Vital Signs BP 136/71 (BP Location: Right Arm)   Pulse (!) 58  Temp 98.3 F (36.8 C) (Oral)   Resp 18   Ht  (1.676 m)   Wt 132 lb (59.9 kg)   SpO2 97%   BMI 21.31 kg/m   Physical Exam  Constitutional: She is oriented to person, place, and time. She appears well-developed and well-nourished. No distress.  HENT:  Head: Normocephalic.    Right Ear: External ear normal.  Left Ear: External ear normal.  Nose: Nose normal.  Eyes: EOM are normal. Pupils are equal, round, and reactive to light. Right eye exhibits no discharge. Left eye exhibits no discharge.  Neck: Normal range of motion. Neck supple.  Some right lateral neck tenderness with ROM but no midline or paraspinal tenderness or  decreased ROM  Cardiovascular: Normal rate, regular rhythm and normal heart sounds.   Pulmonary/Chest: Effort normal and breath sounds normal. She exhibits tenderness (mild).    Abdominal: Soft. She exhibits no distension. There is no tenderness.  Musculoskeletal:       Right hip: She exhibits normal range of motion and no tenderness.       Left hip: She exhibits normal range of motion and no tenderness.       Right knee: She exhibits normal range of motion and no swelling. Tenderness (mild over patella) found.       Left knee: She exhibits normal range of motion and no swelling. Tenderness (mild, over patella) found.       Right hand: She exhibits normal range of motion, no tenderness and no swelling.       Left hand: She exhibits normal range of motion, no tenderness and no swelling.  Neurological: She is alert and oriented to person, place, and time.  CN 3-12 grossly intact. 5/5 strength in all 4 extremities. Grossly normal sensation.   Skin: Skin is warm and dry. She is not diaphoretic.  Nursing note and vitals reviewed.    ED Treatments / Results  Labs (all labs ordered are listed, but only abnormal results are displayed) Labs Reviewed - No data to display  EKG  EKG Interpretation None       Radiology Dg Chest 2 View  Result Date: 04/18/2016 CLINICAL DATA:  Fall today with chest pain, initial encounter EXAM: CHEST  2 VIEW COMPARISON:  None. FINDINGS: The heart size and mediastinal contours are within normal limits. Both lungs are clear. The visualized skeletal structures show degenerative changes of the thoracic spine. IMPRESSION: No active cardiopulmonary disease. Electronically Signed   By: Alcide Clever M.D.   On: 04/18/2016 15:24  Ct Head Wo Contrast  Result Date: 04/18/2016 CLINICAL DATA:  Fall with headaches and right-sided facial bruising, initial encounter EXAM: CT HEAD WITHOUT CONTRAST CT MAXILLOFACIAL WITHOUT CONTRAST TECHNIQUE: Multidetector CT imaging of the  head and maxillofacial structures were performed using the standard protocol without intravenous contrast. Multiplanar CT image reconstructions of the maxillofacial structures were also generated. COMPARISON:  None. FINDINGS: CT HEAD FINDINGS The bony calvarium is intact. Paranasal sinuses are within normal limits. Mild atrophic changes are noted commensurate with the patient's given age. No findings to suggest acute hemorrhage, acute infarction or space-occupying mass lesion are noted. CT MAXILLOFACIAL FINDINGS Degenerative changes of the temporomandibular joints are noted bilaterally. Multiple dental caries are seen. No acute fracture or acute dislocation is noted. No focal hematoma is identified. Mild soft tissue swelling is noted in the chin on the right consistent with the recent injury. Paranasal sinuses are well aerated. IMPRESSION: CT of the head:  No acute intracranial  abnormality is noted. CT of maxillofacial bones: No acute fracture is identified. Mild right shin soft tissue swelling without focal hematoma. Electronically Signed   By: Alcide Clever M.D.   On: 04/18/2016 15:53  Ct Maxillofacial Wo Cm  Result Date: 04/18/2016 CLINICAL DATA:  Fall with headaches and right-sided facial bruising, initial encounter EXAM: CT HEAD WITHOUT CONTRAST CT MAXILLOFACIAL WITHOUT CONTRAST TECHNIQUE: Multidetector CT imaging of the head and maxillofacial structures were performed using the standard protocol without intravenous contrast. Multiplanar CT image reconstructions of the maxillofacial structures were also generated. COMPARISON:  None. FINDINGS: CT HEAD FINDINGS The bony calvarium is intact. Paranasal sinuses are within normal limits. Mild atrophic changes are noted commensurate with the patient's given age. No findings to suggest acute hemorrhage, acute infarction or space-occupying mass lesion are noted. CT MAXILLOFACIAL FINDINGS Degenerative changes of the temporomandibular joints are noted bilaterally.  Multiple dental caries are seen. No acute fracture or acute dislocation is noted. No focal hematoma is identified. Mild soft tissue swelling is noted in the chin on the right consistent with the recent injury. Paranasal sinuses are well aerated. IMPRESSION: CT of the head:  No acute intracranial abnormality is noted. CT of maxillofacial bones: No acute fracture is identified. Mild right shin soft tissue swelling without focal hematoma. Electronically Signed   By: Alcide Clever M.D.   On: 04/18/2016 15:53   Procedures Procedures (including critical care time)  Medications Ordered in ED Medications  acetaminophen (TYLENOL) tablet 650 mg (650 mg Oral Given 04/18/16 1506)     Initial Impression / Assessment and Plan / ED Course  I have reviewed the triage vital signs and the nursing notes.  Pertinent labs & imaging results that were available during my care of the patient were reviewed by me and considered in my medical decision making (see chart for details).  Clinical Course  Comment By Time  Likely patient only has contusions, but given age, baby ASA and family saying she bleeds easily will get CT head/face and plain films Pricilla Loveless, MD 07/29 1502    CXR, CT's unremarkable. Family/patient declined knee xrays, which is low likelihood of fracture anyway. D/c home with return precautions. Mechanical fall.  Final Clinical Impressions(s) / ED Diagnoses   Final diagnoses:  Fall, initial encounter  Chin contusion, initial encounter  Chest wall injury, initial encounter    New Prescriptions New Prescriptions   No medications on file     Pricilla Loveless, MD 04/18/16 1626

## 2016-04-18 NOTE — ED Notes (Signed)
Gave pt ice bag while waiting for room.

## 2016-10-16 ENCOUNTER — Other Ambulatory Visit: Payer: Self-pay | Admitting: Gastroenterology

## 2016-10-16 DIAGNOSIS — B1920 Unspecified viral hepatitis C without hepatic coma: Secondary | ICD-10-CM

## 2016-10-23 ENCOUNTER — Ambulatory Visit
Admission: RE | Admit: 2016-10-23 | Discharge: 2016-10-23 | Disposition: A | Discharge status: Home / Self Care | Payer: Medicare Other | Source: Ambulatory Visit | Attending: Gastroenterology | Admitting: Gastroenterology

## 2016-10-23 DIAGNOSIS — B1920 Unspecified viral hepatitis C without hepatic coma: Secondary | ICD-10-CM

## 2016-10-23 MED ORDER — GADOXETATE DISODIUM 0.25 MMOL/ML IV SOLN
6.0000 mL | Freq: Once | INTRAVENOUS | Status: AC | PRN
  Administered 2016-10-23: 6 mL via INTRAVENOUS

## 2016-11-17 ENCOUNTER — Encounter (HOSPITAL_COMMUNITY): Payer: Self-pay | Admitting: Emergency Medicine

## 2016-11-17 ENCOUNTER — Emergency Department (HOSPITAL_COMMUNITY)
Admission: EM | Admit: 2016-11-17 | Discharge: 2016-11-17 | Disposition: A | Discharge status: Home / Self Care | Payer: Medicare Other | Attending: Emergency Medicine | Admitting: Emergency Medicine

## 2016-11-17 ENCOUNTER — Emergency Department (HOSPITAL_COMMUNITY): Payer: Medicare Other

## 2016-11-17 DIAGNOSIS — Z79899 Other long term (current) drug therapy: Secondary | ICD-10-CM | POA: Diagnosis not present

## 2016-11-17 DIAGNOSIS — R012 Other cardiac sounds: Secondary | ICD-10-CM | POA: Insufficient documentation

## 2016-11-17 DIAGNOSIS — R531 Weakness: Secondary | ICD-10-CM | POA: Insufficient documentation

## 2016-11-17 DIAGNOSIS — I10 Essential (primary) hypertension: Secondary | ICD-10-CM | POA: Diagnosis not present

## 2016-11-17 DIAGNOSIS — R001 Bradycardia, unspecified: Secondary | ICD-10-CM | POA: Insufficient documentation

## 2016-11-17 DIAGNOSIS — R002 Palpitations: Secondary | ICD-10-CM | POA: Insufficient documentation

## 2016-11-17 DIAGNOSIS — R011 Cardiac murmur, unspecified: Secondary | ICD-10-CM

## 2016-11-17 LAB — CBC
HEMATOCRIT: 39.8 % (ref 36.0–46.0)
Hemoglobin: 13.5 g/dL (ref 12.0–15.0)
MCH: 33.9 pg (ref 26.0–34.0)
MCHC: 33.9 g/dL (ref 30.0–36.0)
MCV: 100 fL (ref 78.0–100.0)
Platelets: 80 10*3/uL — ABNORMAL LOW (ref 150–400)
RBC: 3.98 MIL/uL (ref 3.87–5.11)
RDW: 12.9 % (ref 11.5–15.5)
WBC: 2.8 10*3/uL — ABNORMAL LOW (ref 4.0–10.5)

## 2016-11-17 LAB — HEPATIC FUNCTION PANEL
ALK PHOS: 76 U/L (ref 38–126)
ALT: 50 U/L (ref 14–54)
AST: 91 U/L — AB (ref 15–41)
Albumin: 3.2 g/dL — ABNORMAL LOW (ref 3.5–5.0)
BILIRUBIN INDIRECT: 0.5 mg/dL (ref 0.3–0.9)
BILIRUBIN TOTAL: 0.9 mg/dL (ref 0.3–1.2)
Bilirubin, Direct: 0.4 mg/dL (ref 0.1–0.5)
Total Protein: 7.8 g/dL (ref 6.5–8.1)

## 2016-11-17 LAB — BASIC METABOLIC PANEL
ANION GAP: 12 (ref 5–15)
BUN: 9 mg/dL (ref 6–20)
CALCIUM: 9.5 mg/dL (ref 8.9–10.3)
CO2: 26 mmol/L (ref 22–32)
Chloride: 99 mmol/L — ABNORMAL LOW (ref 101–111)
Creatinine, Ser: 0.85 mg/dL (ref 0.44–1.00)
GFR calc Af Amer: >60 mL/min (ref >60)
GLUCOSE: 185 mg/dL — AB (ref 65–99)
POTASSIUM: 3.4 mmol/L — AB (ref 3.5–5.1)
Sodium: 137 mmol/L (ref 135–145)

## 2016-11-17 LAB — I-STAT TROPONIN, ED: Troponin i, poc: 0 ng/mL (ref 0.00–0.08)

## 2016-11-17 LAB — URINALYSIS, ROUTINE W REFLEX MICROSCOPIC
Bilirubin Urine: NEGATIVE
GLUCOSE, UA: NEGATIVE mg/dL
HGB URINE DIPSTICK: NEGATIVE
Ketones, ur: NEGATIVE mg/dL
LEUKOCYTES UA: NEGATIVE
Nitrite: NEGATIVE
PH: 7 (ref 5.0–8.0)
Protein, ur: NEGATIVE mg/dL
Specific Gravity, Urine: 1.01 (ref 1.005–1.030)

## 2016-11-17 LAB — LIPASE, BLOOD: Lipase: 29 U/L (ref 11–51)

## 2016-11-17 MED ORDER — METOPROLOL TARTRATE 50 MG PO TABS: 25.0000 mg | ORAL_TABLET | Freq: Every day | ORAL | 0 refills | Status: DC

## 2016-11-17 MED ORDER — LISINOPRIL-HYDROCHLOROTHIAZIDE 20-12.5 MG PO TABS: 1.0000 | ORAL_TABLET | Freq: Every day | ORAL | 0 refills | Status: DC

## 2016-11-17 MED ORDER — AMLODIPINE BESYLATE 2.5 MG PO TABS: 5.0000 mg | ORAL_TABLET | Freq: Every day | ORAL | 0 refills | Status: DC

## 2016-11-17 NOTE — ED Provider Notes (Signed)
[facility] DEPT Provider Note   CSN: [id number] Arrival date & time: 11/17/16  0910     History   Chief Complaint Chief Complaint  Patient presents with  . Palpitations    HPI Caroline Ortiz is a 83 y.o. female.  HPI Patient has been feeling weak for a few days. She feels like her heart is racing intermittently. The racing quality comes and goes. She denies significant associated chest pain. She does report that she feels very washed out and fatigued. Activity level has been very diminished over the weekend. No documented fever. No nausea or vomiting. No diarrhea. Past Medical History:  Diagnosis Date  . Hep C w/ coma, chronic ([facility])   . High cholesterol   . Hypertension     There are no active problems to display for this patient.   History reviewed. No pertinent surgical history.  OB History    No data available       Home Medications    Prior to Admission medications   Medication Sig Start Date End Date Taking? Authorizing Provider  amLODipine (NORVASC) 2.5 MG tablet Take 2 tablets (5 mg total) by mouth daily. 11/17/16   Charlesetta Shanks, MD  calcium carbonate (OS-CAL) 600 MG TABS tablet Take 600 mg by mouth daily with breakfast.    Historical Provider, MD  cholecalciferol (VITAMIN D) 1000 UNITS tablet Take 1,000 Units by mouth daily.    Historical Provider, MD  gabapentin (NEURONTIN) 100 MG capsule Take 100 mg by mouth 2 (two) times daily.     Historical Provider, MD  HYDROcodone-acetaminophen (NORCO/VICODIN) 5-325 MG per tablet Take 2 tablets by mouth every 4 (four) hours as needed. 04/29/14   Noemi Chapel, MD  lisinopril-hydrochlorothiazide (ZESTORETIC) 20-12.5 MG tablet Take 1 tablet by mouth daily. 11/17/16   Charlesetta Shanks, MD  metoprolol (LOPRESSOR) 50 MG tablet Take 0.5 tablets (25 mg total) by mouth daily. 11/17/16   Charlesetta Shanks, MD  naproxen (NAPROSYN) 500 MG tablet Take 1 tablet (500 mg total) by mouth 2 (two) times daily with a meal. 04/29/14   Noemi Chapel, MD  PRESCRIPTION MEDICATION Take 1 tablet by mouth daily as needed (anxiety). Anxiety medication but did not have the bottle with her    Historical Provider, MD    Family History History reviewed. No pertinent family history.  Social History Social History  Substance Use Topics  . Smoking status: Never Smoker  . Smokeless tobacco: Never Used  . Alcohol use No     Allergies   Penicillins   Review of Systems Review of Systems 10 Systems reviewed and are negative for acute change except as noted in the HPI.   Physical Exam Updated Vital Signs BP 169/85 (BP Location: Right Arm)   Pulse (!) 53   Resp 20   SpO2 99%   Physical Exam  Constitutional: She is oriented to person, place, and time.  Patient is fatigued. She is however alert and nontoxic. No respiratory distress.  HENT:  Mouth/Throat: Oropharynx is clear and moist.  Eyes: EOM are normal. Pupils are equal, round, and reactive to light.  Cardiovascular:  Heart is regular. 3\6 systolic ejection murmur. Pulses are intact.  Pulmonary/Chest: Effort normal and breath sounds normal.  Abdominal: Soft. She exhibits no distension. There is no tenderness. There is no guarding.  Musculoskeletal: Normal range of motion. She exhibits no edema or tenderness.  Neurological: She is alert and oriented to person, place, and time. No cranial nerve deficit. She exhibits normal muscle tone.  Coordination normal.  Skin: Skin is warm and dry.  Psychiatric:  Affect is slightly flat.     ED Treatments / Results  Labs (all labs ordered are listed, but only abnormal results are displayed) Labs Reviewed  BASIC METABOLIC PANEL - Abnormal; Notable for the following:       Result Value   Potassium 3.4 (*)    Chloride 99 (*)    Glucose, Bld 185 (*)    All other components within normal limits  CBC - Abnormal; Notable for the following:    WBC 2.8 (*)    Platelets 80 (*)    All other components within normal limits  HEPATIC  FUNCTION PANEL - Abnormal; Notable for the following:    Albumin 3.2 (*)    AST 91 (*)    All other components within normal limits  URINALYSIS, ROUTINE W REFLEX MICROSCOPIC  LIPASE, BLOOD  I-STAT TROPOININ, ED    EKG  EKG Interpretation  Date/Time:  Tuesday November 17 2016 09:22:28 EST Ventricular Rate:  60 PR Interval:  156 QRS Duration: 98 QT Interval:  432 QTC Calculation: 432 R Axis:   73 Text Interpretation:  Normal sinus rhythm Nonspecific ST abnormality Abnormal ECG no STEMI. no sig change from old Confirmed by Johnney Killian, [place] [id number]) on 11/17/2016 11:55:38 AM       Radiology Dg Chest 2 View  Result Date: 11/17/2016 CLINICAL DATA:  83 year old female with cardiac palpitations this morning. Initial encounter. EXAM: CHEST  2 VIEW COMPARISON:  04/18/2016 and earlier. FINDINGS: Calcified aortic atherosclerosis. Normal cardiac size and mediastinal contours. Normal lung volumes. No pneumothorax, pulmonary edema, pleural effusion or confluent pulmonary opacity. Osteopenia. No acute osseous abnormality identified. Negative visible bowel gas pattern. IMPRESSION: No acute cardiopulmonary abnormality. Calcified aortic atherosclerosis. Electronically Signed   By: Genevie Ann M.D.   On: 11/17/2016 10:16    Procedures Procedures (including critical care time)  Medications Ordered in ED Medications - No data to display   Initial Impression / Assessment and Plan / ED Course  I have reviewed the triage vital signs and the nursing notes.  Pertinent labs & imaging results that were available during my care of the patient were reviewed by me and considered in my medical decision making (see chart for details).     Consult: Reviewed with [facility] hospitalist, they did not feel the patient met criteria for admission and suggested she be referred for outpatient echo.  Final Clinical Impressions(s) / ED Diagnoses   Final diagnoses:  Palpitations  Bradycardia  Heart murmur, systolic    Weakness  Patient has had symptoms of generalized weakness and palpitations. She has not had syncopal episode or chest pain. Patient's troponin is negative. Does not show STEMI pattern. She has had bradycardia in the 50s consistently without tachydysrhythmia episodes. Patient is nontoxic and does not show signs of infectious etiology. No evidence of congestive heart failure on chest x-ray or exam. Patient verbally reports that at one time she was told she had a heart murmur but there is no indication of echocardiogram or documentation in other available notes. At this time plan will be to decrease the patient's beta blocker as this may be causing lightheadedness. I will decrease metoprolol to 25 mg twice daily from 50 mg twice daily. The patient does have hypertension and blood pressures are consistently systolics 811B here in the emergency department. As we will be decreasing the metoprolol, I will have her increase her lisinopril and Norvasc. Patient is advised on close  follow-up with her PCP in contacting cardiology for follow-up to discuss echo and ambulatory monitoring. Patient's family members are with her and are competent and willing to assist the patient and schedule follow-up appointments.  New Prescriptions New Prescriptions   LISINOPRIL-HYDROCHLOROTHIAZIDE (ZESTORETIC) 20-12.5 MG TABLET    Take 1 tablet by mouth daily.     Charlesetta Shanks, MD 11/17/16 1242

## 2016-11-17 NOTE — ED Notes (Signed)
Pt verbalizes understanding of discharge instructions. Family to transport patient home. NAD. A/o x4. Ambulatory at discharge.

## 2016-11-17 NOTE — ED Notes (Signed)
Placed patient into a gown and on the monitor patient is waiting for provider 

## 2016-11-17 NOTE — Discharge Instructions (Signed)
YOUR BLOOD PRESSURE MEDICATION DOSES HAVE BEEN CHANGED. SEE YOUR DOCTOR THIS WEEK TO MONITOR YOUR RESPONSE TO THIS CHANGE AND MAKE ADJUSTMENTS IF NEEDED.  THE DOSE OF YOUR LOPRESSOR (METOPROLOL) HAS BEEN CUT IN HALF.  THE DOSE OF YOUR ZESTORETIC (LISINOPRIL-HCTZ) HAS HAD THE LISINOPRIL PORTION DOUBLED AND THE HCTZ PORTION HAS STAYED THE SAME.  THE DOSE OF YOUR NORVASC (AMLODIPINE) HAS BEEN DOUBLED.

## 2016-11-17 NOTE — ED Triage Notes (Signed)
Pt from home with c/o heart fluttering and generalized weakness for the last couple of days.  Pt reports hx of the same but couldn't tell this RN what she was dx with at that time.  NAD, A&O.

## 2017-06-09 ENCOUNTER — Other Ambulatory Visit: Payer: Self-pay | Admitting: Nurse Practitioner

## 2017-06-09 DIAGNOSIS — J918 Pleural effusion in other conditions classified elsewhere: Secondary | ICD-10-CM

## 2017-06-09 DIAGNOSIS — K7469 Other cirrhosis of liver: Secondary | ICD-10-CM

## 2017-06-09 DIAGNOSIS — K769 Liver disease, unspecified: Secondary | ICD-10-CM

## 2017-06-23 ENCOUNTER — Ambulatory Visit
Admission: RE | Admit: 2017-06-23 | Discharge: 2017-06-23 | Disposition: A | Discharge status: Home / Self Care | Payer: Medicare Other | Source: Ambulatory Visit | Attending: Nurse Practitioner | Admitting: Nurse Practitioner

## 2017-06-23 DIAGNOSIS — K769 Liver disease, unspecified: Secondary | ICD-10-CM

## 2017-06-23 DIAGNOSIS — K7469 Other cirrhosis of liver: Secondary | ICD-10-CM

## 2017-06-23 DIAGNOSIS — J918 Pleural effusion in other conditions classified elsewhere: Secondary | ICD-10-CM

## 2017-06-29 ENCOUNTER — Other Ambulatory Visit: Payer: Self-pay | Admitting: Nurse Practitioner

## 2017-06-29 DIAGNOSIS — K769 Liver disease, unspecified: Secondary | ICD-10-CM

## 2017-06-29 DIAGNOSIS — K7469 Other cirrhosis of liver: Secondary | ICD-10-CM

## 2017-07-29 ENCOUNTER — Other Ambulatory Visit: Payer: Self-pay | Admitting: Internal Medicine

## 2017-07-29 DIAGNOSIS — M25552 Pain in left hip: Secondary | ICD-10-CM

## 2017-07-31 ENCOUNTER — Ambulatory Visit
Admission: RE | Admit: 2017-07-31 | Discharge: 2017-07-31 | Disposition: A | Discharge status: Home / Self Care | Payer: Medicare Other | Source: Ambulatory Visit | Attending: Nurse Practitioner | Admitting: Nurse Practitioner

## 2017-07-31 DIAGNOSIS — K769 Liver disease, unspecified: Secondary | ICD-10-CM

## 2017-07-31 DIAGNOSIS — K7469 Other cirrhosis of liver: Secondary | ICD-10-CM

## 2017-07-31 MED ORDER — GADOXETATE DISODIUM 0.25 MMOL/ML IV SOLN
6.0000 mL | Freq: Once | INTRAVENOUS | Status: AC | PRN
  Administered 2017-07-31: 6 mL via INTRAVENOUS

## 2017-08-09 ENCOUNTER — Ambulatory Visit
Admission: RE | Admit: 2017-08-09 | Discharge: 2017-08-09 | Disposition: A | Discharge status: Home / Self Care | Payer: Medicare Other | Source: Ambulatory Visit | Attending: Internal Medicine | Admitting: Internal Medicine

## 2017-08-09 DIAGNOSIS — M25552 Pain in left hip: Secondary | ICD-10-CM

## 2017-08-09 MED ORDER — METHYLPREDNISOLONE ACETATE 40 MG/ML INJ SUSP (RADIOLOG
120.0000 mg | Freq: Once | INTRAMUSCULAR | Status: AC
  Administered 2017-08-09: 120 mg via INTRA_ARTICULAR

## 2017-08-09 MED ORDER — IOPAMIDOL (ISOVUE-M 200) INJECTION 41%
1.0000 mL | Freq: Once | INTRAMUSCULAR | Status: AC
  Administered 2017-08-09: 1 mL via INTRA_ARTICULAR

## 2017-12-08 ENCOUNTER — Other Ambulatory Visit: Payer: Self-pay | Admitting: Nurse Practitioner

## 2017-12-08 DIAGNOSIS — K7469 Other cirrhosis of liver: Secondary | ICD-10-CM

## 2018-01-28 ENCOUNTER — Other Ambulatory Visit: Payer: Self-pay | Admitting: Family Medicine

## 2018-01-28 DIAGNOSIS — M25552 Pain in left hip: Secondary | ICD-10-CM

## 2018-02-02 ENCOUNTER — Other Ambulatory Visit: Payer: Medicare Other

## 2018-02-03 ENCOUNTER — Ambulatory Visit
Admission: RE | Admit: 2018-02-03 | Discharge: 2018-02-03 | Disposition: A | Discharge status: Home / Self Care | Payer: Medicare Other | Source: Ambulatory Visit | Attending: Family Medicine | Admitting: Family Medicine

## 2018-02-03 DIAGNOSIS — M25552 Pain in left hip: Secondary | ICD-10-CM

## 2018-02-03 MED ORDER — METHYLPREDNISOLONE ACETATE 40 MG/ML INJ SUSP (RADIOLOG
120.0000 mg | Freq: Once | INTRAMUSCULAR | Status: AC
  Administered 2018-02-03: 120 mg via INTRA_ARTICULAR

## 2018-02-03 MED ORDER — IOPAMIDOL (ISOVUE-M 200) INJECTION 41%
1.0000 mL | Freq: Once | INTRAMUSCULAR | Status: AC
  Administered 2018-02-03: 1 mL via INTRA_ARTICULAR

## 2018-02-08 ENCOUNTER — Ambulatory Visit
Admission: RE | Admit: 2018-02-08 | Discharge: 2018-02-08 | Disposition: A | Discharge status: Home / Self Care | Payer: Medicare Other | Source: Ambulatory Visit | Attending: Nurse Practitioner | Admitting: Nurse Practitioner

## 2018-02-08 DIAGNOSIS — K7469 Other cirrhosis of liver: Secondary | ICD-10-CM

## 2018-04-19 ENCOUNTER — Other Ambulatory Visit: Payer: Self-pay | Admitting: Internal Medicine

## 2018-04-19 DIAGNOSIS — M5416 Radiculopathy, lumbar region: Secondary | ICD-10-CM

## 2018-04-26 ENCOUNTER — Ambulatory Visit
Admission: RE | Admit: 2018-04-26 | Discharge: 2018-04-26 | Disposition: A | Discharge status: Home / Self Care | Payer: Medicare Other | Source: Ambulatory Visit | Attending: Internal Medicine | Admitting: Internal Medicine

## 2018-04-26 DIAGNOSIS — M5416 Radiculopathy, lumbar region: Secondary | ICD-10-CM

## 2018-05-25 ENCOUNTER — Ambulatory Visit (INDEPENDENT_AMBULATORY_CARE_PROVIDER_SITE_OTHER): Payer: Medicare Other

## 2018-05-25 ENCOUNTER — Encounter (INDEPENDENT_AMBULATORY_CARE_PROVIDER_SITE_OTHER): Payer: Self-pay | Admitting: Orthopaedic Surgery

## 2018-05-25 ENCOUNTER — Ambulatory Visit (INDEPENDENT_AMBULATORY_CARE_PROVIDER_SITE_OTHER): Payer: Medicare Other | Admitting: Orthopaedic Surgery

## 2018-05-25 DIAGNOSIS — M1612 Unilateral primary osteoarthritis, left hip: Secondary | ICD-10-CM

## 2018-05-25 NOTE — Progress Notes (Signed)
Office Visit Note   Patient: Caroline Ortiz           Date of Birth: [birth year]           MRN: [id number] Visit Date: 05/25/2018              Requested by: Leanna Battles, MD [address] [place], [place] [address] PCP: Leanna Battles, MD   Assessment & Plan: Visit Diagnoses:  1. Primary osteoarthritis of left hip     Plan: Caroline Ortiz unfortunately has severe degenerative disease of her left hip.  I do not think that she would receive any meaningful relief from conservative treatment.  She has tried to cortisone injections in the past that has given her temporary relief.  She would like to try another one for which we will set her up for.  We discussed at length regarding total hip replacement and the risks and benefits and rehab and recovery.  She understands that this is purely to improve quality of life and activity.  They will think about this and let us know what they decide.  Given her age we would need preoperative clearance from her PCP.  They have my card if they need to get in touch with me. Total face to face encounter time was greater than 45 minutes and over half of this time was spent in counseling and/or coordination of care.  Follow-Up Instructions: Return if symptoms worsen or fail to improve.   Orders:  Orders Placed This Encounter  Procedures  . XR HIP UNILAT W OR W/O PELVIS 2-3 VIEWS LEFT  . Ambulatory referral to Physical Medicine Rehab   No orders of the defined types were placed in this encounter.     Procedures: No procedures performed   Clinical Data: No additional findings.   Subjective: Chief Complaint  Patient presents with  . Left Hip - Pain    Caroline Ortiz is a very pleasant and active 84 year old female comes in with 2 years of chronic left hip pain that radiates into her thigh.  She walks with a limp and uses a cane.  She presents today with HER-2 daughters.  She does not exercise anymore secondary to the hip pain.  Limited by this and has  had a decline in quality of life.   Review of Systems  Constitutional: Negative.   HENT: Negative.   Eyes: Negative.   Respiratory: Negative.   Cardiovascular: Negative.   Endocrine: Negative.   Musculoskeletal: Negative.   Neurological: Negative.   Hematological: Negative.   Psychiatric/Behavioral: Negative.   All other systems reviewed and are negative.    Objective: Vital Signs: There were no vitals taken for this visit.  Physical Exam  Constitutional: She is oriented to person, place, and time. She appears well-developed and well-nourished.  HENT:  Head: Normocephalic and atraumatic.  Eyes: EOM are normal.  Neck: Neck supple.  Pulmonary/Chest: Effort normal.  Abdominal: Soft.  Neurological: She is alert and oriented to person, place, and time.  Skin: Skin is warm. Capillary refill takes less than 2 seconds.  Psychiatric: She has a normal mood and affect. Her behavior is normal. Judgment and thought content normal.  Nursing note and vitals reviewed.   Ortho Exam Left hip exam shows positive FADIR.  Minimal range of motion.  Positive Stinchfield sign.  Negative sciatic tension sign.  Trochanteric bursa is nontender. Specialty Comments:  No specialty comments available.  Imaging: Xr Hip Unilat W Or W/o Pelvis 2-3 Views Left  Result Date: 05/25/2018  Severe left hip degenerative joint disease    PMFS History: There are no active problems to display for this patient.  Past Medical History:  Diagnosis Date  . Hep C w/ coma, chronic ([facility])   . High cholesterol   . Hypertension     History reviewed. No pertinent family history.  History reviewed. No pertinent surgical history. Social History   Occupational History  . Not on file  Tobacco Use  . Smoking status: Never Smoker  . Smokeless tobacco: Never Used  Substance and Sexual Activity  . Alcohol use: No  . Drug use: No  . Sexual activity: Not on file

## 2018-06-02 ENCOUNTER — Ambulatory Visit (INDEPENDENT_AMBULATORY_CARE_PROVIDER_SITE_OTHER): Payer: Self-pay | Admitting: Physical Medicine and Rehabilitation

## 2018-06-08 ENCOUNTER — Ambulatory Visit (INDEPENDENT_AMBULATORY_CARE_PROVIDER_SITE_OTHER): Payer: Self-pay | Admitting: Physical Medicine and Rehabilitation

## 2018-06-10 ENCOUNTER — Telehealth (INDEPENDENT_AMBULATORY_CARE_PROVIDER_SITE_OTHER): Payer: Self-pay | Admitting: Orthopaedic Surgery

## 2018-06-10 NOTE — Telephone Encounter (Signed)
See message.

## 2018-06-10 NOTE — Telephone Encounter (Signed)
Patient's daughter called stating that the patient would like for Dr. Roda ShuttersXu to speak with the other doctors in regards to getting her clearance for her hip replacement surgery.  CB#[phone].  If she does not answer, you can leave her a message and advise her of the next steps. Thank you.

## 2018-06-11 NOTE — Telephone Encounter (Signed)
Ok we will communicate through medical records and if PCP feels any testing is necessary before surgery, we or the PCP will certainly order them.  We will not proceed with surgery until she is cleared

## 2018-06-13 NOTE — Telephone Encounter (Signed)
Called to advise. They are aware.

## 2018-06-28 ENCOUNTER — Ambulatory Visit (INDEPENDENT_AMBULATORY_CARE_PROVIDER_SITE_OTHER): Payer: Self-pay

## 2018-06-28 ENCOUNTER — Encounter (INDEPENDENT_AMBULATORY_CARE_PROVIDER_SITE_OTHER): Payer: Self-pay | Admitting: Physical Medicine and Rehabilitation

## 2018-06-28 ENCOUNTER — Ambulatory Visit (INDEPENDENT_AMBULATORY_CARE_PROVIDER_SITE_OTHER): Payer: Medicare Other | Admitting: Physical Medicine and Rehabilitation

## 2018-06-28 DIAGNOSIS — M25552 Pain in left hip: Secondary | ICD-10-CM | POA: Diagnosis not present

## 2018-06-28 NOTE — Patient Instructions (Signed)

## 2018-06-28 NOTE — Progress Notes (Signed)
 .  Numeric Pain Rating Scale and Functional Assessment Average Pain 2   In the last MONTH (on 0-10 scale) has pain interfered with the following?  1. General activity like being  able to carry out your everyday physical activities such as walking, climbing stairs, carrying groceries, or moving a chair?  Rating(1)   -Dye Allergies.  

## 2018-06-28 NOTE — Progress Notes (Signed)
   Caroline Ortiz - 84 y.o. female MRN [id number]  Date of birth: July 18, 1932  Office Visit Note: Visit Date: 06/28/2018 PCP: Caroline Matin, MD Referred by: Caroline Matin, MD  Subjective: No chief complaint on file.  HPI: Caroline Ortiz is a 84 y.o. female who comes in today For planned left intra-articular hip injection and anesthetic arthrogram.  Patient has significant left hip and groin pain worse with standing and ambulating.  ROS Otherwise per HPI.  Assessment & Plan: Visit Diagnoses:  1. Pain in left hip     Plan: Findings:  Left hip anesthetic intra-articular arthrogram with fluoroscopic guidance.  Patient did have relief during the anesthetic phase of the injection.    Meds & Orders: No orders of the defined types were placed in this encounter.   Orders Placed This Encounter  Procedures  . Large Joint Inj: L hip joint  . XR C-ARM NO REPORT    Follow-up: Return if symptoms worsen or fail to improve.   Procedures: Large Joint Inj: L hip joint on 06/28/2018 1:03 PM Indications: pain and diagnostic evaluation Details: 22 G needle, anterior approach  Arthrogram: Yes  Medications: 80 mg triamcinolone acetonide 40 MG/ML; 3 mL bupivacaine 0.5 % Outcome: tolerated well, no immediate complications  Arthrogram demonstrated excellent flow of contrast throughout the joint surface without extravasation or obvious defect.  The patient had relief of symptoms during the anesthetic phase of the injection.  Procedure, treatment alternatives, risks and benefits explained, specific risks discussed. Consent was given by the patient. Immediately prior to procedure a time out was called to verify the correct patient, procedure, equipment, support staff and site/side marked as required. Patient was prepped and draped in the usual sterile fashion.      No notes on file   Clinical History: No specialty comments available.   She reports that she has never smoked. She has  never used smokeless tobacco. No results for input(s): HGBA1C, LABURIC in the last 8760 hours.  Objective:  VS:  HT:    WT:   BMI:     BP:   HR: bpm  TEMP: ( )  RESP:  Physical Exam  Ortho Exam Imaging: No results found.  Past Medical/Family/Surgical/Social History: Medications & Allergies reviewed per EMR, new medications updated. There are no active problems to display for this patient.  Past Medical History:  Diagnosis Date  . Hep C w/ coma, chronic (HCC)   . High cholesterol   . Hypertension    History reviewed. No pertinent family history. History reviewed. No pertinent surgical history. Social History   Occupational History  . Not on file  Tobacco Use  . Smoking status: Never Smoker  . Smokeless tobacco: Never Used  Substance and Sexual Activity  . Alcohol use: No  . Drug use: No  . Sexual activity: Not on file

## 2018-07-12 MED ORDER — TRIAMCINOLONE ACETONIDE 40 MG/ML IJ SUSP
80.0000 mg | INTRAMUSCULAR | Status: AC | PRN
  Administered 2018-06-28: 80 mg via INTRA_ARTICULAR

## 2018-07-12 MED ORDER — BUPIVACAINE HCL 0.5 % IJ SOLN
3.0000 mL | INTRAMUSCULAR | Status: AC | PRN
  Administered 2018-06-28: 3 mL via INTRA_ARTICULAR

## 2018-07-26 ENCOUNTER — Other Ambulatory Visit: Payer: Self-pay | Admitting: Gastroenterology

## 2018-07-26 DIAGNOSIS — K7460 Unspecified cirrhosis of liver: Secondary | ICD-10-CM

## 2018-08-04 ENCOUNTER — Ambulatory Visit
Admission: RE | Admit: 2018-08-04 | Discharge: 2018-08-04 | Disposition: A | Discharge status: Home / Self Care | Payer: Medicare Other | Source: Ambulatory Visit | Attending: Gastroenterology | Admitting: Gastroenterology

## 2018-08-04 DIAGNOSIS — K7460 Unspecified cirrhosis of liver: Secondary | ICD-10-CM

## 2018-08-16 ENCOUNTER — Other Ambulatory Visit: Payer: Self-pay | Admitting: Gastroenterology

## 2018-08-16 DIAGNOSIS — K769 Liver disease, unspecified: Secondary | ICD-10-CM

## 2018-08-16 DIAGNOSIS — K7460 Unspecified cirrhosis of liver: Secondary | ICD-10-CM

## 2018-09-02 ENCOUNTER — Ambulatory Visit
Admission: RE | Admit: 2018-09-02 | Discharge: 2018-09-02 | Disposition: A | Discharge status: Home / Self Care | Payer: Medicare Other | Source: Ambulatory Visit | Attending: Gastroenterology | Admitting: Gastroenterology

## 2018-09-02 DIAGNOSIS — K769 Liver disease, unspecified: Secondary | ICD-10-CM

## 2018-09-02 DIAGNOSIS — K7460 Unspecified cirrhosis of liver: Secondary | ICD-10-CM

## 2018-09-02 MED ORDER — GADOBENATE DIMEGLUMINE 529 MG/ML IV SOLN
12.0000 mL | Freq: Once | INTRAVENOUS | Status: AC | PRN
  Administered 2018-09-02: 12 mL via INTRAVENOUS

## 2018-09-15 IMAGING — XA DG FLUORO GUIDE NDL PLC/BX
2 series · 5 of 5 positions shown · non-contrast
Comparison: none

CLINICAL DATA: Left hip pain

[Series 1: ortho standard · 1 of 1 slices shown (1 of 2)]
[im 1/1]
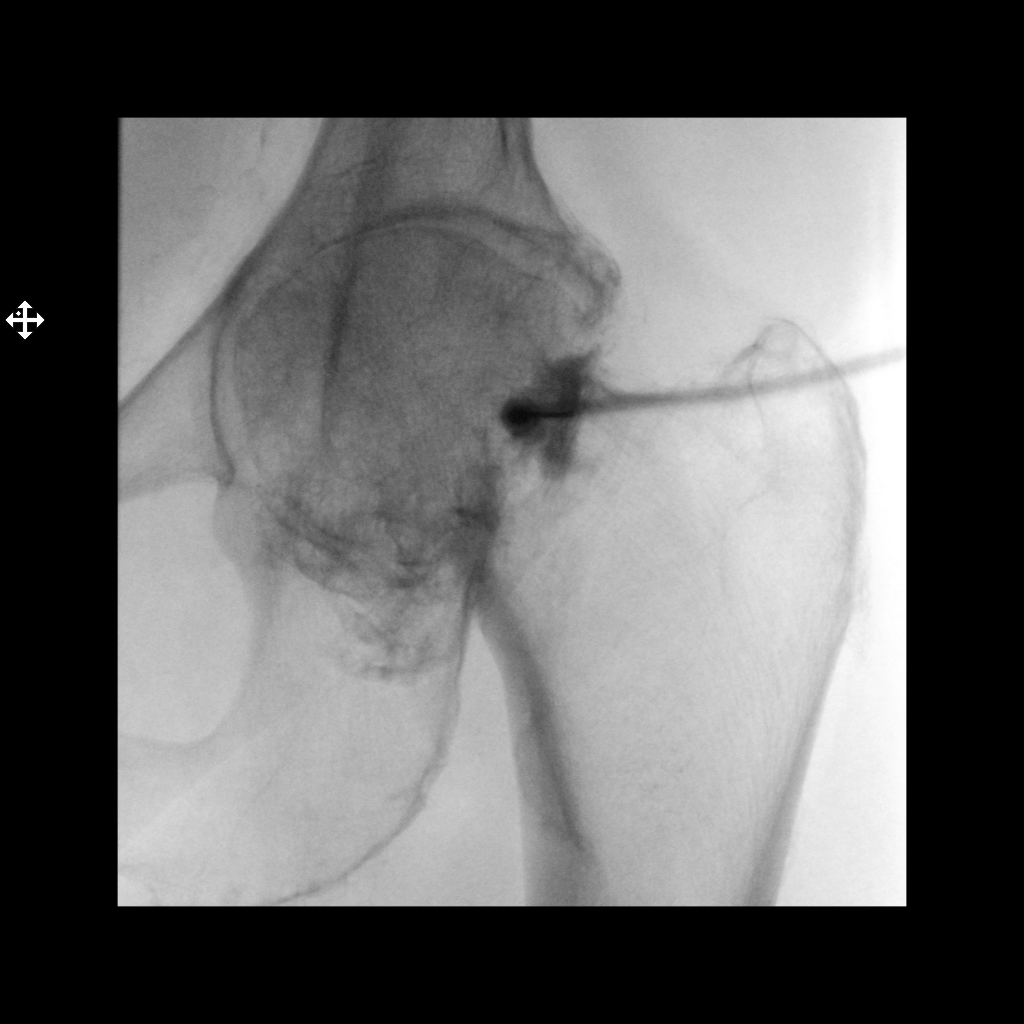

[Series 3: ortho standard · 2 acquisitions, 4 frames shown (2 of 2)]
[im 1/2]
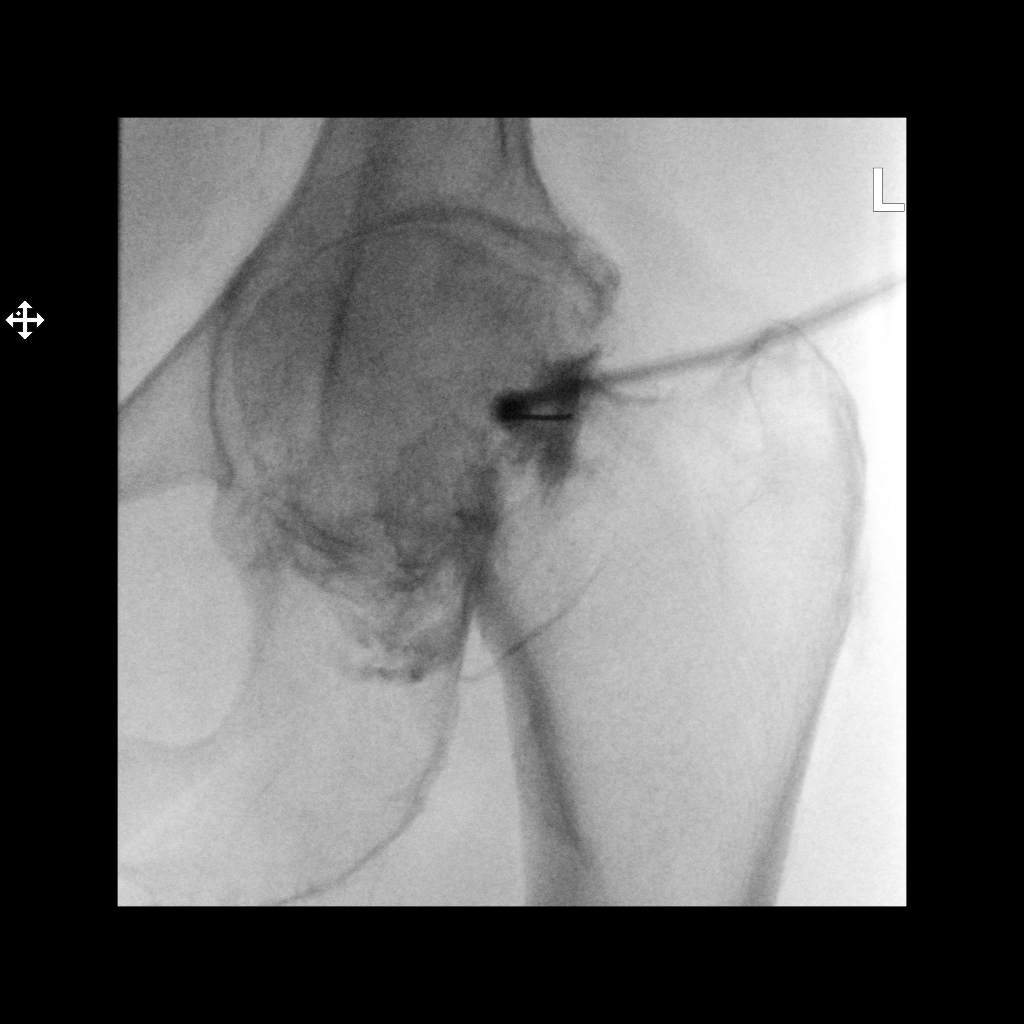
[im 1/2]
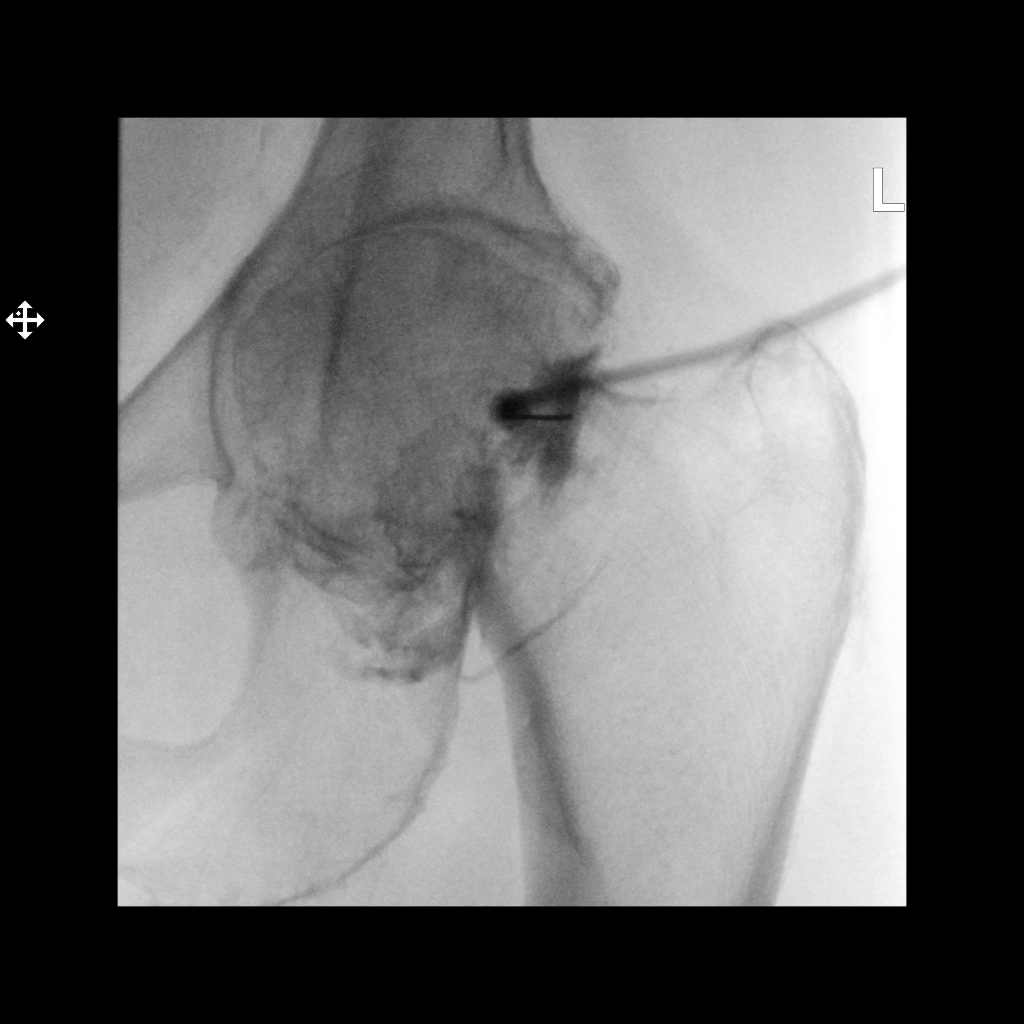
[im 1/2]
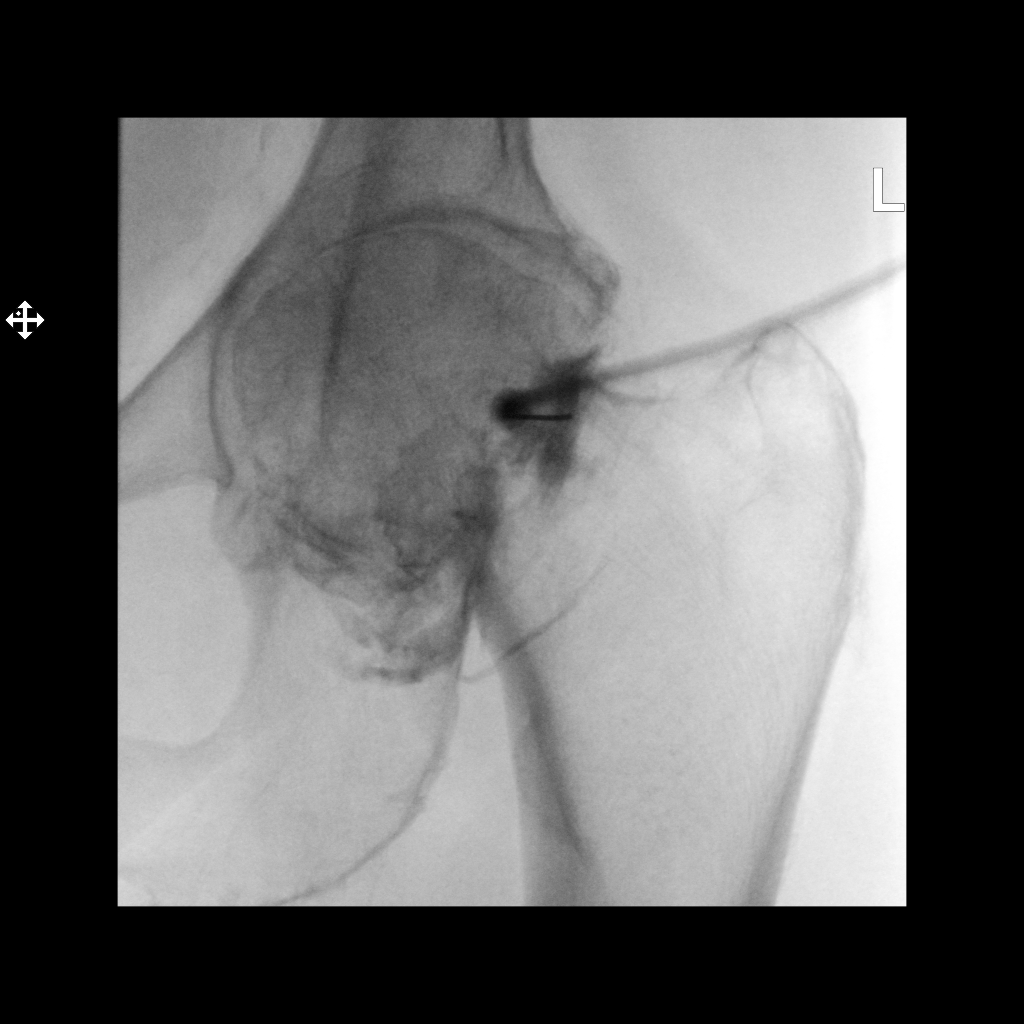
[im 2/2]
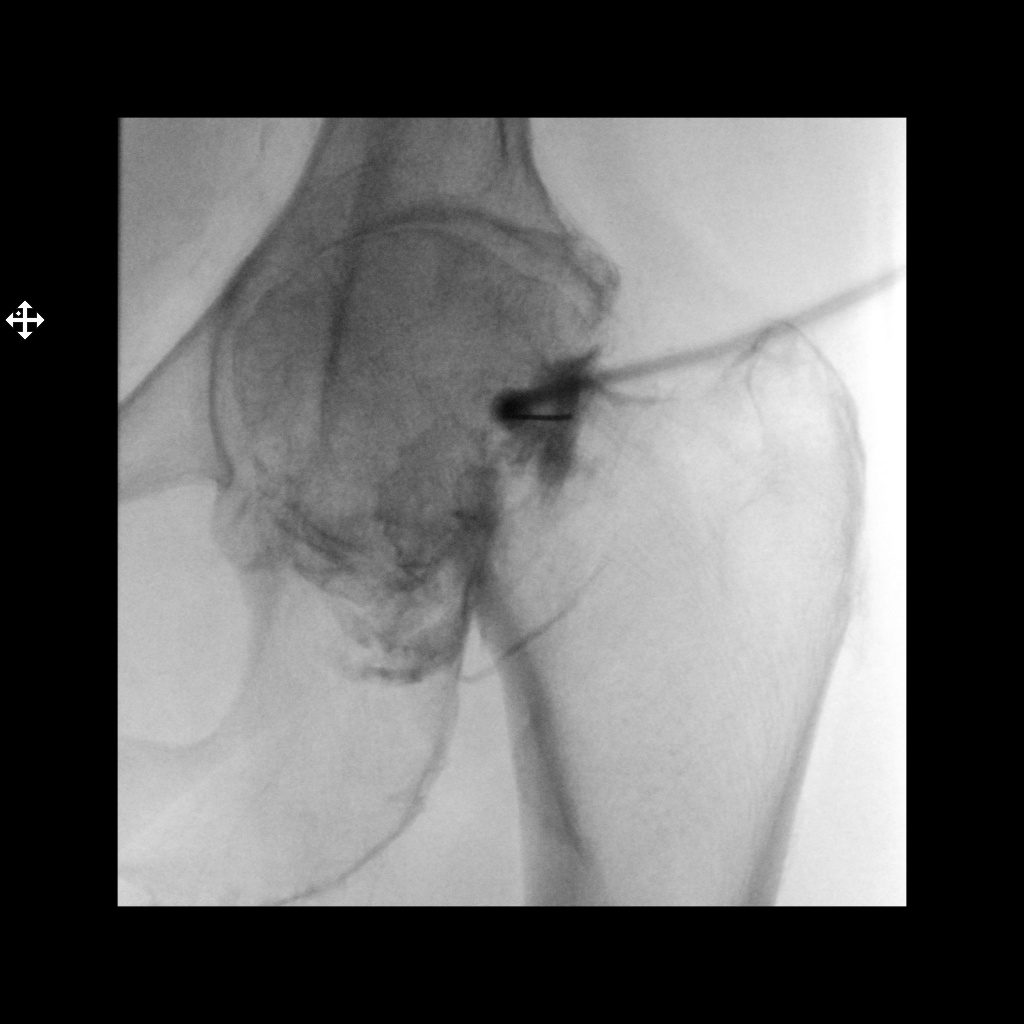

[5 of 5 positions shown; findings below may reference images not displayed]

EXAM:
LEFT HIP INJECTION UNDER FLUOROSCOPY

FLUOROSCOPY TIME:  16 seconds

PROCEDURE:
Overlying skin prepped with Betadine, draped in the usual sterile
fashion, and infiltrated locally with buffered Lidocaine. Curved 22
gauge spinal needle advanced to the superolateral margin of the left
femoral head. 1 ml of Lidocaine injected easily. Diagnostic
injection of iodinated contrastdemonstrates intra-articular spread
without intravascular component.

120mg Depo-Medrol and 5 cc 1% lidocaine were then administered. No
immediate complication.
IMPRESSION: Technically successful left hip injection under fluoroscopy.

## 2018-09-21 ENCOUNTER — Emergency Department (HOSPITAL_COMMUNITY): Payer: Medicare Other

## 2018-09-21 ENCOUNTER — Encounter (HOSPITAL_COMMUNITY): Payer: Self-pay

## 2018-09-21 ENCOUNTER — Emergency Department (HOSPITAL_COMMUNITY)
Admission: EM | Admit: 2018-09-21 | Discharge: 2018-09-21 | Disposition: A | Discharge status: Home / Self Care | Payer: Medicare Other | Attending: Emergency Medicine | Admitting: Emergency Medicine

## 2018-09-21 DIAGNOSIS — R002 Palpitations: Secondary | ICD-10-CM

## 2018-09-21 DIAGNOSIS — Z79899 Other long term (current) drug therapy: Secondary | ICD-10-CM | POA: Insufficient documentation

## 2018-09-21 DIAGNOSIS — I10 Essential (primary) hypertension: Secondary | ICD-10-CM | POA: Insufficient documentation

## 2018-09-21 LAB — CBC WITH DIFFERENTIAL/PLATELET
ABS IMMATURE GRANULOCYTES: 0 10*3/uL (ref 0.00–0.07)
Basophils Absolute: 0 10*3/uL (ref 0.0–0.1)
Basophils Relative: 0 %
Eosinophils Absolute: 0 10*3/uL (ref 0.0–0.5)
Eosinophils Relative: 0 %
HEMATOCRIT: 35.6 % — AB (ref 36.0–46.0)
HEMOGLOBIN: 11.7 g/dL — AB (ref 12.0–15.0)
Immature Granulocytes: 0 %
LYMPHS ABS: 0.7 10*3/uL (ref 0.7–4.0)
LYMPHS PCT: 21 %
MCH: 32.6 pg (ref 26.0–34.0)
MCHC: 32.9 g/dL (ref 30.0–36.0)
MCV: 99.2 fL (ref 80.0–100.0)
MONO ABS: 0.3 10*3/uL (ref 0.1–1.0)
Monocytes Relative: 10 %
NEUTROS ABS: 2.2 10*3/uL (ref 1.7–7.7)
Neutrophils Relative %: 69 %
Platelets: 94 10*3/uL — ABNORMAL LOW (ref 150–400)
RBC: 3.59 MIL/uL — ABNORMAL LOW (ref 3.87–5.11)
RDW: 13.2 % (ref 11.5–15.5)
WBC: 3.2 10*3/uL — ABNORMAL LOW (ref 4.0–10.5)
nRBC: 0 % (ref 0.0–0.2)

## 2018-09-21 LAB — BASIC METABOLIC PANEL
Anion gap: 11 (ref 5–15)
BUN: 11 mg/dL (ref 8–23)
CHLORIDE: 105 mmol/L (ref 98–111)
CO2: 25 mmol/L (ref 22–32)
Calcium: 9.6 mg/dL (ref 8.9–10.3)
Creatinine, Ser: 0.74 mg/dL (ref 0.44–1.00)
GFR calc Af Amer: >60 mL/min (ref >60)
GFR calc non Af Amer: >60 mL/min (ref >60)
GLUCOSE: 120 mg/dL — AB (ref 70–99)
POTASSIUM: 3.3 mmol/L — AB (ref 3.5–5.1)
Sodium: 141 mmol/L (ref 135–145)

## 2018-09-21 LAB — URINALYSIS, ROUTINE W REFLEX MICROSCOPIC
Bilirubin Urine: NEGATIVE
Glucose, UA: NEGATIVE mg/dL
Hgb urine dipstick: NEGATIVE
LEUKOCYTES UA: NEGATIVE
NITRITE: NEGATIVE
PH: 7.5 (ref 5.0–8.0)
Protein, ur: NEGATIVE mg/dL
SPECIFIC GRAVITY, URINE: 1.01 (ref 1.005–1.030)

## 2018-09-21 LAB — I-STAT TROPONIN, ED: TROPONIN I, POC: 0 ng/mL (ref 0.00–0.08)

## 2018-09-21 MED ORDER — ACETAMINOPHEN 325 MG PO TABS
650.0000 mg | ORAL_TABLET | Freq: Once | ORAL | Status: DC
  Filled 2018-09-21: qty 2

## 2018-09-21 NOTE — ED Notes (Signed)
Patient verbalizes understanding of discharge instructions. Opportunity for questioning and answers were provided. Armband removed by staff, pt discharged from ED ambulatory.   

## 2018-09-21 NOTE — ED Provider Notes (Signed)
[facility]   CSN: [id number] Arrival date & time: 09/21/18  1100     History   Chief Complaint Chief Complaint  Patient presents with  . Weakness    Generalized    HPI Caroline Ortiz is a 85 y.o. female.  Patient is a 85 year old female with a history of hypertension, hepatitis C who presents with palpitations.  She states that she has had palpitations intermittently in the past but she had an episode this morning that lasted longer than her typical palpitations.  She states she felt like her heart was racing and it lasts about 2 hours.  She felt very fatigued with it.  She felt a little lightheaded but did not feel like she was going to pass out.  She had a little bit of shortness of breath with the palpitations but no associated chest pain.  She denies any leg pain or swelling.  No recent illnesses.  No vomiting or fevers.  No cough or cold symptoms.  Her family reports a history of atrial fibrillation but I do not see that this is documented anywhere.     Past Medical History:  Diagnosis Date  . Hep C w/ coma, chronic (HCC)   . High cholesterol   . Hypertension     There are no active problems to display for this patient.   History reviewed. No pertinent surgical history.   OB History   No obstetric history on file.      Home Medications    Prior to Admission medications   Medication Sig Start Date End Date Taking? Authorizing Provider  acetaminophen (TYLENOL 8 HOUR ARTHRITIS PAIN) 650 MG CR tablet Take 1,300 mg by mouth every 8 (eight) hours as needed for pain.   Yes [provider]  amLODipine (NORVASC) 2.5 MG tablet Take 2 tablets (5 mg total) by mouth daily. 11/17/16  Yes Arby BarrettePfeiffer, Marcy, MD  cyclobenzaprine (FLEXERIL) 5 MG tablet Take 5 mg by mouth 3 (three) times daily as needed for muscle spasms.   Yes [provider]  gabapentin (NEURONTIN) 300 MG capsule Take 300 mg by mouth 2 (two)  times daily.    Yes [provider]  lisinopril-hydrochlorothiazide (ZESTORETIC) 20-12.5 MG tablet Take 1 tablet by mouth daily. 11/17/16  Yes Arby BarrettePfeiffer, Marcy, MD  metoprolol (LOPRESSOR) 50 MG tablet Take 0.5 tablets (25 mg total) by mouth daily. Patient taking differently: Take 50 mg by mouth daily.  11/17/16  Yes Arby BarrettePfeiffer, Marcy, MD  Multiple Vitamin (MULTIVITAMIN WITH MINERALS) TABS tablet Take 1 tablet by mouth daily.   Yes [provider]  PRESCRIPTION MEDICATION Take 1 tablet by mouth daily as needed (anxiety). Anxiety medication but did not have the bottle with her   Yes [provider]  HYDROcodone-acetaminophen (NORCO/VICODIN) 5-325 MG per tablet Take 2 tablets by mouth every 4 (four) hours as needed. Patient not taking: Reported on 09/21/2018 04/29/14   Eber HongMiller, Brian, MD  naproxen (NAPROSYN) 500 MG tablet Take 1 tablet (500 mg total) by mouth 2 (two) times daily with a meal. Patient not taking: Reported on 09/21/2018 04/29/14   Eber HongMiller, Brian, MD    Family History History reviewed. No pertinent family history.  Social History Social History   Tobacco Use  . Smoking status: Never Smoker  . Smokeless tobacco: Never Used  Substance Use Topics  . Alcohol use: No  . Drug use: No     Allergies   Penicillins   Review of Systems  Review of Systems  Constitutional: Positive for fatigue. Negative for chills, diaphoresis and fever.  HENT: Negative for congestion, rhinorrhea and sneezing.   Eyes: Negative.   Respiratory: Positive for shortness of breath. Negative for cough and chest tightness.   Cardiovascular: Positive for palpitations. Negative for chest pain and leg swelling.  Gastrointestinal: Negative for abdominal pain, blood in stool, diarrhea, nausea and vomiting.  Genitourinary: Negative for difficulty urinating, flank pain, frequency and hematuria.  Musculoskeletal: Negative for arthralgias and back pain.  Skin: Negative for rash.  Neurological:  Positive for weakness (Generalized). Negative for dizziness, speech difficulty, numbness and headaches.     Physical Exam Updated Vital Signs BP (!) 156/82 (BP Location: Right Arm)   Pulse 67   Temp 97.9 F (36.6 C) (Oral)   Resp 16   SpO2 97%   Physical Exam Constitutional:      Appearance: She is well-developed.  HENT:     Head: Normocephalic and atraumatic.  Eyes:     Pupils: Pupils are equal, round, and reactive to light.  Neck:     Musculoskeletal: Normal range of motion and neck supple.  Cardiovascular:     Rate and Rhythm: Normal rate and regular rhythm.     Heart sounds: Murmur present.  Pulmonary:     Effort: Pulmonary effort is normal. No respiratory distress.     Breath sounds: Normal breath sounds. No wheezing or rales.  Chest:     Chest wall: No tenderness.  Abdominal:     General: Bowel sounds are normal.     Palpations: Abdomen is soft.     Tenderness: There is no abdominal tenderness. There is no guarding or rebound.  Musculoskeletal: Normal range of motion.     Comments: No edema or leg tenderness  Lymphadenopathy:     Cervical: No cervical adenopathy.  Skin:    General: Skin is warm and dry.     Findings: No rash.  Neurological:     Mental Status: She is alert and oriented to person, place, and time.      ED Treatments / Results  Labs (all labs ordered are listed, but only abnormal results are displayed) Labs Reviewed  BASIC METABOLIC PANEL - Abnormal; Notable for the following components:      Result Value   Potassium 3.3 (*)    Glucose, Bld 120 (*)    All other components within normal limits  CBC WITH DIFFERENTIAL/PLATELET - Abnormal; Notable for the following components:   WBC 3.2 (*)    RBC 3.59 (*)    Hemoglobin 11.7 (*)    HCT 35.6 (*)    Platelets 94 (*)    All other components within normal limits  URINALYSIS, ROUTINE W REFLEX MICROSCOPIC - Abnormal; Notable for the following components:   Color, Urine YELLOW (*)     APPearance CLEAR (*)    Ketones, ur TRACE (*)    All other components within normal limits  I-STAT TROPONIN, ED    EKG None ED ECG REPORT   Date: 09/21/2018  Rate: 67  Rhythm: normal sinus rhythm  QRS Axis: normal  Intervals: normal  ST/T Wave abnormalities: normal  Conduction Disutrbances:none  Narrative Interpretation:   Old EKG Reviewed: unchanged  I have personally reviewed the EKG tracing and agree with the computerized printout as noted.  Radiology Dg Chest 2 View  Result Date: 09/21/2018 CLINICAL DATA:  85 year old who had an episode of syncope associated with palpitations this morning. Current history of hypertension. EXAM: CHEST -  2 VIEW COMPARISON:  11/17/2016 and earlier. FINDINGS: AP ERECT and LATERAL images were obtained. Cardiac silhouette normal in size, unchanged. Mitral annular calcification as noted previously. Thoracic aorta mildly atherosclerotic. Hilar and mediastinal contours otherwise unremarkable. Lungs clear. Bronchovascular markings normal. Pulmonary vascularity normal. No visible pleural effusions. No pneumothorax. Degenerative changes involving the thoracic spine and both shoulders. IMPRESSION: No acute cardiopulmonary disease. Electronically Signed   By: Hulan Saashomas  Lawrence M.D.   On: 09/21/2018 12:42    Procedures Procedures (including critical care time)  Medications Ordered in ED Medications  acetaminophen (TYLENOL) tablet 650 mg (650 mg Oral Refused 09/21/18 1353)     Initial Impression / Assessment and Plan / ED Course  I have reviewed the triage vital signs and the nursing notes.  Pertinent labs & imaging results that were available during my care of the patient were reviewed by me and considered in my medical decision making (see chart for details).     Patient presents with palpitations and weakness.  She has had no further episodes of palpitations in the ED.  She had no visible arrhythmias.  Her chest x-ray is clear without evidence of fluid  overload or pneumonia.  Her EKG shows a sinus rhythm with no ischemic changes.  Her labs are non-concerning.  Her troponin was negative even though it was not reported in the labs.  It was not crossing over but was negative.  She does not have any signs of a UTI.  She is feeling much better and is currently asymptomatic.  She is able to drink fluids without difficulty.  She was seen here previously with palpitations and heart murmur.  She was supposed to follow-up with cardiology but she has not yet done that.  I will refer her back to cardiology.  She did not have any significant symptoms with the palpitations such as near syncope or syncope.  I feel that she can be discharged home with cardiology follow-up.  Return precautions were given.  Final Clinical Impressions(s) / ED Diagnoses   Final diagnoses:  Palpitations    ED Discharge Orders    None       Rolan BuccoBelfi, Carisa Backhaus, MD 09/21/18 1744

## 2018-09-21 NOTE — ED Notes (Signed)
Patient refused Tylenol for leg cramping, took daily gabapentin from home meds, which pt takes daily and has not taken yet today. MD informed. Pt and family aware of need for urine sample

## 2018-09-21 NOTE — ED Notes (Signed)
Patient reminded of need for urine sample, unable to provide one at this time despite large amount of water consumed.

## 2018-09-21 NOTE — ED Triage Notes (Signed)
Pt arrived via [facility] frm hm with c/o gen wkness for past hr; pt just "doesn't feel well."  Pt states that she feels dehydrated and hypertensive upon EMS arrival 174/100; denies N/V/blurred vision; CBG 148; 18; 99% on RA; pt rec'd approx 63ml NS

## 2018-09-21 NOTE — ED Notes (Signed)
I-stat troponin results not crossing over into patient's chart. Result is negative. MD informed.

## 2018-09-27 ENCOUNTER — Encounter: Payer: Self-pay | Admitting: Cardiology

## 2018-09-27 ENCOUNTER — Ambulatory Visit: Payer: Medicare Other | Admitting: Cardiology

## 2018-09-27 VITALS — BP 142/70 | HR 58 | Ht 66.0 in | Wt 137.0 lb

## 2018-09-27 DIAGNOSIS — R002 Palpitations: Secondary | ICD-10-CM | POA: Diagnosis not present

## 2018-09-27 DIAGNOSIS — I10 Essential (primary) hypertension: Secondary | ICD-10-CM | POA: Diagnosis not present

## 2018-09-27 NOTE — Patient Instructions (Signed)
Medication Instructions:  Your Physician recommend you make the following changes to your medication.  If you need a refill on your cardiac medications before your next appointment, please call your pharmacy.   Lab work: None  Testing/Procedures: Our physician has recommended that you wear an 7 DAY ZIO-PATCH monitor. The Zio patch cardiac monitor continuously records heart rhythm data for up to 14 days, this is for patients being evaluated for multiple types heart rhythms. For the first 24 hours post application, please avoid getting the Zio monitor wet in the shower or by excessive sweating during exercise. After that, feel free to carry on with regular activities. Keep soaps and lotions away from the ZIO XT Patch.   This will be placed at our [facility] location - [address].     Your physician has requested that you have an echocardiogram. Echocardiography is a painless test that uses sound waves to create images of your heart. It provides your doctor with information about the size and shape of your heart and how well your heart's chambers and valves are working. This procedure takes approximately one hour. There are no restrictions for this procedure. [address]    Follow-Up: At [facility], you and your health needs are our priority.  As part of our continuing mission to provide you with exceptional heart care, we have created designated Provider Care Teams.  These Care Teams include your primary Cardiologist (physician) and Advanced Practice Providers (APPs -  Physician Assistants and Nurse Practitioners) who all work together to provide you with the care you need, when you need it. You will need a follow up appointment in 3 months.  Please call our office 2 months in advance to schedule this appointment.  You may see Dr. Cristal Deer or one of the following Advanced Practice Providers on your designated Care Team:   Theodore Demark, PA-C . Joni Reining, DNP, ANP

## 2018-09-27 NOTE — Progress Notes (Signed)
Cardiology Office Note:    Date:  09/27/2018   ID:  Caroline Ortiz, DOB [birth year], MRN [id number]  PCP:  Jarome Matin, MD  Cardiologist:  Jodelle Red, MD PhD  Referring MD: Jarome Matin, MD   CC: palpitations  History of Present Illness:    Caroline Ortiz is a 85 y.o. female with a hx of hypertension, hyperlipidemia, hepatitis C who is seen as a new consult at the request of Jarome Matin, MD for the evaluation and management of palpitations.  History obtained from patient and her daughter.  Tachycardia/palpitations: -Initial onset: a long time, many years -Frequency: every few days -Duration: not too long, seconds to minutes -Associated symptoms: shortness of breath, lightheadedness. -Aggravating/alleviating factors: no clear -Syncope/near syncope: maybe many years ago, none recently -Prior cardiac history: none -Prior ECG: sinus -Prior workup: none -Prior treatment: none -Possible medication interactions: -Caffeine: drinks coffee -Alcohol: no -Tobacco: no -OTC supplements: hair and nail supplement -Labs: TSH, kidney function/electrolytes, CBC reviewed. -Cardiac ROS: no chest pain, no PND, no orthopnea, no LE edema. -Family history: no heart issues.   Past Medical History:  Diagnosis Date  . Hep C w/ coma, chronic (HCC)   . High cholesterol   . Hypertension     Past Surgical History:  Procedure Laterality Date  . HYSTEROTOMY    . KNEE SURGERY      Current Medications: Current Outpatient Medications on File Prior to Visit  Medication Sig  . acetaminophen (TYLENOL 8 HOUR ARTHRITIS PAIN) 650 MG CR tablet Take 1,300 mg by mouth every 8 (eight) hours as needed for pain.  Marland Kitchen amLODipine (NORVASC) 2.5 MG tablet Take 2 tablets (5 mg total) by mouth daily.  . cyclobenzaprine (FLEXERIL) 5 MG tablet Take 5 mg by mouth 3 (three) times daily as needed for muscle spasms.  Marland Kitchen gabapentin (NEURONTIN) 300 MG capsule Take 300 mg by mouth 2 (two) times  daily.   Marland Kitchen HYDROcodone-acetaminophen (NORCO/VICODIN) 5-325 MG per tablet Take 2 tablets by mouth every 4 (four) hours as needed.  Marland Kitchen lisinopril-hydrochlorothiazide (ZESTORETIC) 20-12.5 MG tablet Take 1 tablet by mouth daily.  . metoprolol (LOPRESSOR) 50 MG tablet Take 0.5 tablets (25 mg total) by mouth daily. (Patient taking differently: Take 50 mg by mouth daily. )  . Multiple Vitamin (MULTIVITAMIN WITH MINERALS) TABS tablet Take 1 tablet by mouth daily.  . naproxen (NAPROSYN) 500 MG tablet Take 1 tablet (500 mg total) by mouth 2 (two) times daily with a meal.  . PRESCRIPTION MEDICATION Take 1 tablet by mouth daily as needed (anxiety). Anxiety medication but did not have the bottle with her   No current facility-administered medications on file prior to visit.      Allergies:   Penicillins   Social History   Socioeconomic History  . Marital status: Married    Spouse name: Not on file  . Number of children: Not on file  . Years of education: Not on file  . Highest education level: Not on file  Occupational History  . Not on file  Social Needs  . Financial resource strain: Not on file  . Food insecurity:    Worry: Not on file    Inability: Not on file  . Transportation needs:    Medical: Not on file    Non-medical: Not on file  Tobacco Use  . Smoking status: Never Smoker  . Smokeless tobacco: Never Used  Substance and Sexual Activity  . Alcohol use: No  . Drug use: No  . Sexual  activity: Not on file  Lifestyle  . Physical activity:    Days per week: Not on file    Minutes per session: Not on file  . Stress: Not on file  Relationships  . Social connections:    Talks on phone: Not on file    Gets together: Not on file    Attends religious service: Not on file    Active member of club or organization: Not on file    Attends meetings of clubs or organizations: Not on file    Relationship status: Not on file  Other Topics Concern  . Not on file  Social History Narrative    . Not on file   Married, lives with husband. No alcohol, no tobacco. Does senior exercise classes 2 days/week for 45 min each.   Family History: The patient's family history includes Breast cancer in her daughter and sister; Prostate cancer in her brother and father; Stomach cancer in her daughter and mother.  ROS:   Please see the history of present illness.  Additional pertinent ROS:  Constitutional: Negative for chills, fever, night sweats, unintentional weight loss. Positive for fatigue. HENT: Negative for ear pain and hearing loss.   Eyes: Negative for loss of vision and eye pain.  Respiratory: Negative for cough, sputum, shortness of breath, wheezing.   Cardiovascular: Positive for palpitations. Negative for chest pain, PND, orthopnea, lower extremity edema and claudication.  Gastrointestinal: Negative for abdominal pain, melena, and hematochezia.  Genitourinary: Negative for dysuria and hematuria.  Musculoskeletal: Negative for falls and myalgias.  Skin: Negative for itching and rash.  Neurological: Negative for focal weakness, focal sensory changes and loss of consciousness.  Endo/Heme/Allergies: Does not bruise/bleed easily.    EKGs/Labs/Other Studies Reviewed:    The following studies were reviewed today: Prior notes  EKG:  EKG is personally reviewed.  The ekg ordered today demonstrates sinus bradycardia  Recent Labs: 09/21/2018: BUN 11; Creatinine, Ser 0.74; Hemoglobin 11.7; Platelets 94; Potassium 3.3; Sodium 141  Recent Lipid Panel No results found for: CHOL, TRIG, HDL, CHOLHDL, VLDL, LDLCALC, LDLDIRECT  Physical Exam:    VS:  BP (!) 142/70   Pulse (!) 58   Ht 5\' 6"  (1.676 m)   Wt 137 lb (62.1 kg)   BMI 22.11 kg/m     Wt Readings from Last 3 Encounters:  09/27/18 137 lb (62.1 kg)  04/18/16 132 lb (59.9 kg)  04/29/14 134 lb (60.8 kg)     GEN: Well nourished, well developed in no acute distress. Frail appearing HEENT: Normal NECK: No JVD; No carotid  bruits LYMPHATICS: No lymphadenopathy CARDIAC: regular rhythm, normal S1 and S2, no murmurs, rubs, gallops. Radial and DP pulses 2+ bilaterally. RESPIRATORY:  Clear to auscultation without rales, wheezing or rhonchi  ABDOMEN: Soft, non-tender, non-distended MUSCULOSKELETAL:  No edema; No deformity  SKIN: Warm and dry NEUROLOGIC:  Alert and oriented x 3 PSYCHIATRIC:  Normal affect   ASSESSMENT:    1. Palpitation   2. Essential hypertension    PLAN:    1. Palpitations: have been intermittent for many years -TSH normal, electrolytes WNL -will get 7 day Zio for further evaluation -echocardiogram to rule out structural heart disease.  -no significant prior cardiac history, no significant family history of heart disease  2. Hypertension: mildly elevated today, managed by PCP.  -on amlodipine 2.5 mg, lisinopril-HCTZ 20-12.5 mg, metoprolol 50 mg (unclear if this is succinate or tartrate). Could increase amlodipine if remains elevated.   Plan for follow up: 3  mos  Medication Adjustments/Labs and Tests Ordered: Current medicines are reviewed at length with the patient today.  Concerns regarding medicines are outlined above.  Orders Placed This Encounter  Procedures  . LONG TERM MONITOR (3-14 DAYS)  . EKG 12-Lead  . ECHOCARDIOGRAM COMPLETE   No orders of the defined types were placed in this encounter.   Patient Instructions  Medication Instructions:  Your Physician recommend you make the following changes to your medication.  If you need a refill on your cardiac medications before your next appointment, please call your pharmacy.   Lab work: None  Testing/Procedures: Our physician has recommended that you wear an 7 DAY ZIO-PATCH monitor. The Zio patch cardiac monitor continuously records heart rhythm data for up to 14 days, this is for patients being evaluated for multiple types heart rhythms. For the first 24 hours post application, please avoid getting the Zio monitor wet in  the shower or by excessive sweating during exercise. After that, feel free to carry on with regular activities. Keep soaps and lotions away from the ZIO XT Patch.   [facility] [address], [address].     Your physician has requested that you have an echocardiogram. Echocardiography is a painless test that uses sound waves to create images of your heart. It provides your doctor with information about the size and shape of your heart and how well your heart's chambers and valves are working. This procedure takes approximately one hour. There are no restrictions for this procedure. [address]    Follow-Up: At [facility], you and your health needs are our priority.  As part of our continuing mission to provide you with exceptional heart care, we have created designated Provider Care Teams.  These Care Teams include your primary Cardiologist (physician) and Advanced Practice Providers (APPs -  Physician Assistants and Nurse Practitioners) who all work together to provide you with the care you need, when you need it. You will need a follow up appointment in 3 months.  Please call our office 2 months in advance to schedule this appointment.  You may see Dr. Cristal Deerhristopher or one of the following Advanced Practice Providers on your designated Care Team:   Theodore DemarkRhonda Barrett, PA-C . Joni ReiningKathryn Lawrence, DNP, ANP        Signed, Jodelle RedBridgette Gissella Niblack, MD PhD 09/27/2018 3:48 PM    [facility]

## 2018-10-05 ENCOUNTER — Other Ambulatory Visit: Payer: Self-pay

## 2018-10-05 ENCOUNTER — Ambulatory Visit (HOSPITAL_COMMUNITY): Payer: Medicare Other | Attending: Internal Medicine

## 2018-10-05 ENCOUNTER — Ambulatory Visit (INDEPENDENT_AMBULATORY_CARE_PROVIDER_SITE_OTHER): Payer: Medicare Other

## 2018-10-05 DIAGNOSIS — R002 Palpitations: Secondary | ICD-10-CM

## 2018-10-11 ENCOUNTER — Telehealth: Payer: Self-pay | Admitting: Cardiology

## 2018-10-11 NOTE — Telephone Encounter (Signed)
  Patient's daughter returning call from [place] but not sure what it is regarding

## 2018-10-11 NOTE — Telephone Encounter (Signed)
Called patient, gave ECHO results. Patient verbalized understanding.   FYI to nurse.

## 2018-10-25 ENCOUNTER — Telehealth: Payer: Self-pay | Admitting: Cardiology

## 2018-10-25 NOTE — Telephone Encounter (Signed)
Monitor results discussed with the patients daughter.

## 2018-10-25 NOTE — Telephone Encounter (Signed)
New Message   Patients daughter is returning call in reference to monitor results. Please call to discuss.

## 2018-11-14 ENCOUNTER — Ambulatory Visit: Payer: Medicare Other | Admitting: Cardiology

## 2018-11-14 ENCOUNTER — Encounter: Payer: Self-pay | Admitting: Cardiology

## 2018-11-14 VITALS — BP 118/58 | HR 77 | Ht 66.0 in | Wt 135.0 lb

## 2018-11-14 DIAGNOSIS — R002 Palpitations: Secondary | ICD-10-CM

## 2018-11-14 DIAGNOSIS — I348 Other nonrheumatic mitral valve disorders: Secondary | ICD-10-CM

## 2018-11-14 DIAGNOSIS — Z01810 Encounter for preprocedural cardiovascular examination: Secondary | ICD-10-CM | POA: Insufficient documentation

## 2018-11-14 DIAGNOSIS — I3489 Other nonrheumatic mitral valve disorders: Secondary | ICD-10-CM | POA: Insufficient documentation

## 2018-11-14 DIAGNOSIS — I471 Supraventricular tachycardia, unspecified: Secondary | ICD-10-CM | POA: Insufficient documentation

## 2018-11-14 DIAGNOSIS — I10 Essential (primary) hypertension: Secondary | ICD-10-CM | POA: Diagnosis not present

## 2018-11-14 NOTE — Patient Instructions (Signed)

## 2018-11-14 NOTE — Progress Notes (Signed)
Cardiology Office Note:    Date:  11/14/2018   ID:  Caroline Ortiz, DOB Mar 24, 1932, MRN [id number]  PCP:  Jarome Matin, MD  Cardiologist:  Jodelle Red, MD PhD  Referring MD: Jarome Matin, MD   CC: preoperative evaluation  History of Present Illness:    Caroline Ortiz is a 85 y.o. female with a hx of hypertension, hyperlipidemia, hepatitis C who is seen for preoperative cardiology evaluation today.   Cardiac history: palpitations, present for many years, occurring every few days. Last seconds to minutes. Associated with shortness of breath, lightheadedness. No recent syncope.   Preoperative cardiovascular evaluation Planned surgery: hip replacement, no date yet per patient and her daughter Pertinent past cardiac history: palpitations, as above Prior cardiac workup: echo and monitor 09/2018 History of valve disease: no significant valve issues on echo History of CAD/PAD/CVA/TIA: none History of heart failure: none History of arrhythmia: brief SVT On anticoagulation: no Additional history (hypertension, diabetes/on insulin, CKD/current creatinine, OSA, anesthesia complications): hypertension Current symptoms: occasional palpitations Functional capacity: goes to senior exercise classes Tuesday and Thursdays without issue. Can climb stairs but takes time due to hip pain.  Overall no concerns for active cardiac issues. Daughter notes that the patient keeps going back and forth over if she wants surgery. Is in a lot of discomfort from joint pain.  Denies chest pain, shortness of breath at rest or with normal exertion. No PND, orthopnea, LE edema or unexpected weight gain. No syncope.  Past Medical History:  Diagnosis Date  . Hep C w/ coma, chronic (HCC)   . High cholesterol   . Hypertension     Past Surgical History:  Procedure Laterality Date  . HYSTEROTOMY    . KNEE SURGERY      Current Medications: Current Outpatient Medications on File Prior to  Visit  Medication Sig  . acetaminophen (TYLENOL 8 HOUR ARTHRITIS PAIN) 650 MG CR tablet Take 1,300 mg by mouth every 8 (eight) hours as needed for pain.  Marland Kitchen amLODipine (NORVASC) 2.5 MG tablet Take 2 tablets (5 mg total) by mouth daily.  . cyclobenzaprine (FLEXERIL) 5 MG tablet Take 5 mg by mouth 3 (three) times daily as needed for muscle spasms.  Marland Kitchen gabapentin (NEURONTIN) 300 MG capsule Take 300 mg by mouth 2 (two) times daily.   Marland Kitchen HYDROcodone-acetaminophen (NORCO/VICODIN) 5-325 MG per tablet Take 2 tablets by mouth every 4 (four) hours as needed.  Marland Kitchen lisinopril-hydrochlorothiazide (ZESTORETIC) 20-12.5 MG tablet Take 1 tablet by mouth daily.  . metoprolol (LOPRESSOR) 50 MG tablet Take 0.5 tablets (25 mg total) by mouth daily. (Patient taking differently: Take 50 mg by mouth daily. )  . Multiple Vitamin (MULTIVITAMIN WITH MINERALS) TABS tablet Take 1 tablet by mouth daily.  . naproxen (NAPROSYN) 500 MG tablet Take 1 tablet (500 mg total) by mouth 2 (two) times daily with a meal.  . PRESCRIPTION MEDICATION Take 1 tablet by mouth daily as needed (anxiety). Anxiety medication but did not have the bottle with her   No current facility-administered medications on file prior to visit.      Allergies:   Penicillins   Social History   Socioeconomic History  . Marital status: Married    Spouse name: Not on file  . Number of children: Not on file  . Years of education: Not on file  . Highest education level: Not on file  Occupational History  . Not on file  Social Needs  . Financial resource strain: Not on file  . Food  insecurity:    Worry: Not on file    Inability: Not on file  . Transportation needs:    Medical: Not on file    Non-medical: Not on file  Tobacco Use  . Smoking status: Never Smoker  . Smokeless tobacco: Never Used  Substance and Sexual Activity  . Alcohol use: No  . Drug use: No  . Sexual activity: Not on file  Lifestyle  . Physical activity:    Days per week: Not on  file    Minutes per session: Not on file  . Stress: Not on file  Relationships  . Social connections:    Talks on phone: Not on file    Gets together: Not on file    Attends religious service: Not on file    Active member of club or organization: Not on file    Attends meetings of clubs or organizations: Not on file    Relationship status: Not on file  Other Topics Concern  . Not on file  Social History Narrative  . Not on file   Married, lives with husband. No alcohol, no tobacco. Does senior exercise classes 2 days/week for 45 min each.   Family History: The patient's family history includes Breast cancer in her daughter and sister; Prostate cancer in her brother and father; Stomach cancer in her daughter and mother.  ROS:   Please see the history of present illness.  Additional pertinent ROS:  Constitutional: Negative for chills, fever, night sweats, unintentional weight loss. Positive for fatigue. HENT: Negative for ear pain and hearing loss.   Eyes: Negative for loss of vision and eye pain.  Respiratory: Negative for cough, sputum, shortness of breath, wheezing.   Cardiovascular: as per HPI Gastrointestinal: Negative for abdominal pain, melena, and hematochezia.  Genitourinary: Negative for dysuria and hematuria.  Musculoskeletal: Negative for falls and myalgias.  Skin: Negative for itching and rash.  Neurological: Negative for focal weakness, focal sensory changes and loss of consciousness.  Endo/Heme/Allergies: Does not bruise/bleed easily.    EKGs/Labs/Other Studies Reviewed:    The following studies were reviewed today: Monitor 10/24/18 ~7 days of data recorded on Zio monitor. No VT, atrial fibrillation, high degree block, or pauses noted. Isolated ventricular ectopy was rare (<1%), and isolated atrial ectopy occurred 1.1% of all beats. There were 0 patient triggered events.   Patient had a min HR of 50 bpm, max HR of 203 bpm, and avg HR of 68 bpm. Predominant  underlying rhythm was Sinus Rhythm. 16 Supraventricular Tachycardia runs occurred, the run with the fastest interval lasting 17 beats with a max rate of 203 bpm, the longest lasting 13 beats with an avg rate of 93 bpm. Ventricular Bigeminy and Trigeminy were rare.  Echo 10/05/18 Left ventricle: The cavity size was normal. Wall thickness was   increased in a pattern of moderate LVH. Systolic function was   vigorous. The estimated ejection fraction was in the range of 65%   to 70%. Wall motion was normal; there were no regional wall   motion abnormalities. Doppler parameters are consistent with   abnormal left ventricular relaxation (grade 1 diastolic   dysfunction). - Aortic valve: Trileaflet; moderately thickened, mildly calcified   leaflets. Transvalvular velocity was within the normal range.   There was no stenosis. There was trivial regurgitation. - Mitral valve: Severely calcified annulus. Systolic anterior   motion of the mitral valve. There was mild, late systolic   regurgitation. Mean gradient (D): 1 mm Hg (HR 66 bpm). -  Left atrium: The atrium was severely dilated. - Tricuspid valve: There was mild regurgitation.  Impressions: - Hyperdynamic LV function with systolic anterior motion of the   mitral valve and mild late systolic mitral regurgitation.   Angulation of the basal septum, without significant color flow   acceleration. This study did not detect a significant LVOT   obstructive gradient.     Severe mitral annular calcification of the posterior annulus with   no significant stenosis at resting heart rate.  EKG:  EKG is personally reviewed.  The ekg ordered 09/27/18 demonstrates sinus bradycardia  Recent Labs: 09/21/2018: BUN 11; Creatinine, Ser 0.74; Hemoglobin 11.7; Platelets 94; Potassium 3.3; Sodium 141  Recent Lipid Panel No results found for: CHOL, TRIG, HDL, CHOLHDL, VLDL, LDLCALC, LDLDIRECT  Physical Exam:    VS:  BP (!) 118/58 (BP Location: Left Arm, Patient  Position: Sitting, Cuff Size: Normal)   Pulse 77   Ht 5\' 6"  (1.676 m)   Wt 135 lb (61.2 kg)   BMI 21.79 kg/m     Wt Readings from Last 3 Encounters:  11/14/18 135 lb (61.2 kg)  09/27/18 137 lb (62.1 kg)  04/18/16 132 lb (59.9 kg)     GEN: Well nourished, well developed in no acute distress. Frail appearing HEENT: Normal NECK: No JVD; No carotid bruits LYMPHATICS: No lymphadenopathy CARDIAC: regular rhythm, normal S1 and S2, no murmurs, rubs, gallops. Radial and DP pulses 2+ bilaterally. RESPIRATORY:  Clear to auscultation without rales, wheezing or rhonchi  ABDOMEN: Soft, non-tender, non-distended MUSCULOSKELETAL:  No edema; No deformity  SKIN: Warm and dry NEUROLOGIC:  Alert and oriented x 3 PSYCHIATRIC:  Normal affect   ASSESSMENT:    1. Preop cardiovascular exam   2. Palpitations   3. Essential hypertension   4. Paroxysmal SVT (supraventricular tachycardia) (HCC)   5. Systolic anterior movement of mitral valve    PLAN:    Preoperative cardiovascular examination: From a cardiovascular perspective, she is low risk for a moderate risk surgery such as hip replacement. She does not require any additional testing from a cardiovascular perspective as she can achieve 4 METs or greater.  Recommend continuing beta blocker perioperatively. Please see comments on this dosing below.  However, I defer comment on her chronic liver disease and resultant pancytopenia to her other medical specialists. There is no contraindication from a cardiac perspective.   Basal septal hypertrophy with systolic anterior motion of the mitral valve and late systolic MR: avoid dehydration with surgery, as this will worsen SAM. No significant LVOT gradient on echo  Palpitations: have been intermittent for many years -TSH normal, electrolytes WNL -paroxysmal SVT, continue metoprolol  Hypertension: at goal today  -on amlodipine 2.5 mg, lisinopril-HCTZ 20-12.5 mg, metoprolol tartrate 50 mg  daily-->recommend changing to 25 mg BID tartrate or switching to 50 mg metoprolol succinate daily as tartrate is not a long acting form. She receives this from Dr. Eloise Harman, will send my note with recommendations.  Plan for follow up: 6 mos or sooner PRN  TIME SPENT WITH PATIENT: 25 minutes of direct patient care. More than 50% of that time was spent on coordination of care and counseling regarding preoperative risk assessment and recommendations.  Jodelle Red, MD, PhD [place]  [facility]   Medication Adjustments/Labs and Tests Ordered: Current medicines are reviewed at length with the patient today.  Concerns regarding medicines are outlined above.  No orders of the defined types were placed in this encounter.  No orders of the defined types  were placed in this encounter.   Patient Instructions  Medication Instructions:  Your Physician recommend you continue on your current medication as directed.    If you need a refill on your cardiac medications before your next appointment, please call your pharmacy.   Lab work: None  Testing/Procedures: None  Follow-Up: At [facility], you and your health needs are our priority.  As part of our continuing mission to provide you with exceptional heart care, we have created designated Provider Care Teams.  These Care Teams include your primary Cardiologist (physician) and Advanced Practice Providers (APPs -  Physician Assistants and Nurse Practitioners) who all work together to provide you with the care you need, when you need it. You will need a follow up appointment in 6 months.  Please call our office 2 months in advance to schedule this appointment.  You may see Jodelle Red, MD or one of the following Advanced Practice Providers on your designated Care Team:   Theodore Demark, PA-C . Joni Reining, DNP, ANP       Signed, Jodelle Red, MD PhD 11/14/2018 6:12 PM    [facility]

## 2018-11-15 ENCOUNTER — Encounter (INDEPENDENT_AMBULATORY_CARE_PROVIDER_SITE_OTHER): Payer: Self-pay | Admitting: Physical Medicine and Rehabilitation

## 2018-11-15 ENCOUNTER — Ambulatory Visit (INDEPENDENT_AMBULATORY_CARE_PROVIDER_SITE_OTHER): Payer: Medicare Other | Admitting: Physical Medicine and Rehabilitation

## 2018-11-15 ENCOUNTER — Ambulatory Visit (INDEPENDENT_AMBULATORY_CARE_PROVIDER_SITE_OTHER): Payer: Self-pay

## 2018-11-15 DIAGNOSIS — M25552 Pain in left hip: Secondary | ICD-10-CM

## 2018-11-15 NOTE — Progress Notes (Signed)
Caroline Ortiz - 85 y.o. female MRN [id number]  Date of birth: [birth year]  Office Visit Note: Visit Date: 11/15/2018 PCP: Jarome Matin, MD Referred by: Jarome Matin, MD  Subjective: Chief Complaint  Patient presents with  . Left Hip - Pain   HPI: Caroline Ortiz is a 85 y.o. female who comes in today For planned repeat left intra-articular hip injection.  Patient has severe left hip pain groin pain worse with ambulating and moving.  She is really quite uncomfortable today.  Prior diagnostic arthrogram was very beneficial in October.  She is followed by Dr. Glee Arvin in our office.  They had discussed hip replacement and she has had cardiac clearance performed.  This cardiac clearance stated that she would be low risk for hip replacement surgery.  The patient's daughter suggest that the doctor who manages her hepatitis felt like it might not be a good surgery to undergo and I have asked them to follow-up with Dr. Glee Arvin to discuss this because she probably would do well with hip replacement.  She has pretty severe osteoarthritis of the left hip.  We will repeat the left hip injection today.  ROS Otherwise per HPI.  Assessment & Plan: Visit Diagnoses:  1. Pain in left hip     Plan: Findings:  Patient had outstanding relief during the anesthetic phase of the injection.    Meds & Orders: No orders of the defined types were placed in this encounter.   Orders Placed This Encounter  Procedures  . Large Joint Inj: L hip joint  . XR C-ARM NO REPORT    Follow-up: Return in about 2 weeks (around 11/29/2018) for Glee Arvin, MD for review hip arthroplasty.   Procedures: Large Joint Inj: L hip joint on 11/15/2018 3:12 PM Indications: diagnostic evaluation and pain Details: 22 G 3.5 in needle, fluoroscopy-guided anterior approach  Arthrogram: No  Medications: 80 mg triamcinolone acetonide 40 MG/ML; 3 mL bupivacaine 0.5 % Outcome: tolerated well, no immediate  complications  There was excellent flow of contrast producing a partial arthrogram of the hip. The patient did have relief of symptoms during the anesthetic phase of the injection. Procedure, treatment alternatives, risks and benefits explained, specific risks discussed. Consent was given by the patient. Immediately prior to procedure a time out was called to verify the correct patient, procedure, equipment, support staff and site/side marked as required. Patient was prepped and draped in the usual sterile fashion.      No notes on file   Clinical History: No specialty comments available.   She reports that she has never smoked. She has never used smokeless tobacco. No results for input(s): HGBA1C, LABURIC in the last 8760 hours.  Objective:  VS:  HT:    WT:   BMI:     BP:   HR: bpm  TEMP: ( )  RESP:  Physical Exam  Ortho Exam Imaging: Xr C-arm No Report  Result Date: 11/15/2018 Please see Notes tab for imaging impression.   Past Medical/Family/Surgical/Social History: Medications & Allergies reviewed per EMR, new medications updated. Patient Active Problem List   Diagnosis Date Noted  . Systolic anterior movement of mitral valve 11/14/2018  . Paroxysmal SVT (supraventricular tachycardia) (HCC) 11/14/2018  . Essential hypertension 11/14/2018  . Palpitations 11/14/2018  . Preop cardiovascular exam 11/14/2018   Past Medical History:  Diagnosis Date  . Hep C w/ coma, chronic (HCC)   . High cholesterol   . Hypertension    Family History  Problem Relation Age of Onset  . Stomach cancer Mother   . Prostate cancer Father   . Breast cancer Sister   . Prostate cancer Brother   . Breast cancer Daughter   . Stomach cancer Daughter    Past Surgical History:  Procedure Laterality Date  . HYSTEROTOMY    . KNEE SURGERY     Social History   Occupational History  . Not on file  Tobacco Use  . Smoking status: Never Smoker  . Smokeless tobacco: Never Used  Substance and  Sexual Activity  . Alcohol use: No  . Drug use: No  . Sexual activity: Not on file

## 2018-11-15 NOTE — Progress Notes (Signed)
 .  Numeric Pain Rating Scale and Functional Assessment Average Pain 10   In the last MONTH (on 0-10 scale) has pain interfered with the following?  1. General activity like being  able to carry out your everyday physical activities such as walking, climbing stairs, carrying groceries, or moving a chair?  Rating(8)   -Dye Allergies.  

## 2018-11-16 MED ORDER — BUPIVACAINE HCL 0.5 % IJ SOLN
3.0000 mL | INTRAMUSCULAR | Status: AC | PRN
  Administered 2018-11-15: 3 mL via INTRA_ARTICULAR

## 2018-11-16 MED ORDER — TRIAMCINOLONE ACETONIDE 40 MG/ML IJ SUSP
80.0000 mg | INTRAMUSCULAR | Status: AC | PRN
  Administered 2018-11-15: 80 mg via INTRA_ARTICULAR

## 2018-11-29 ENCOUNTER — Encounter (INDEPENDENT_AMBULATORY_CARE_PROVIDER_SITE_OTHER): Payer: Self-pay | Admitting: Orthopaedic Surgery

## 2018-11-29 ENCOUNTER — Ambulatory Visit (INDEPENDENT_AMBULATORY_CARE_PROVIDER_SITE_OTHER): Payer: Medicare Other | Admitting: Orthopaedic Surgery

## 2018-11-29 VITALS — Ht 66.0 in | Wt 135.0 lb

## 2018-11-29 DIAGNOSIS — M1612 Unilateral primary osteoarthritis, left hip: Secondary | ICD-10-CM

## 2018-11-29 NOTE — Progress Notes (Signed)
Office Visit Note   Patient: Caroline Ortiz           Date of Birth: 1932/01/22           MRN: [id number] Visit Date: 11/29/2018              Requested by: Jarome Matin, MD [address] [place], [place] [address] PCP: Jarome Matin, MD   Assessment & Plan: Visit Diagnoses:  1. Primary osteoarthritis of left hip     Plan: Impression is end-stage left hip degenerative joint disease.  We had a long discussion today regarding the risks associated with a total hip replacement.  She is more elderly than her average total hip replacement patient which could prolong recovery.  In the end she understands that this is a quality of life decision that she has to make.  She certainly seems to be significantly affected by her hip pain and this is also a stress on the entire family.  For now we will continue to allow the recent hip injection to help with her pain.  She can follow-up with [place] as needed. Total face to face encounter time was greater than 25 minutes and over half of this time was spent in counseling and/or coordination of care.  Follow-Up Instructions: Return if symptoms worsen or fail to improve.   Orders:  No orders of the defined types were placed in this encounter.  No orders of the defined types were placed in this encounter.     Procedures: No procedures performed   Clinical Data: No additional findings.   Subjective: Chief Complaint  Patient presents with  . Left Hip - Follow-up    S/p hip injection with FN 11/15/2018    Caroline Ortiz follows up today for her left hip degenerative joint disease.  She recently had a cortisone injection with Dr. Alvester Morin on 11/15/2018.  She is actually feeling pretty good from this injection.  They are still undecided about whether to proceed with a total hip replacement or not.  They were told by the gastroenterologist Dr. Bosie Clos and Elsie Stain that she would be at elevated risk due to her liver disease.  She would be a low risk from a  cardiac standpoint per the cardiologist.   Review of Systems   Objective: Vital Signs: Ht 5\' 6"  (1.676 m)   Wt 135 lb (61.2 kg)   BMI 21.79 kg/m   Physical Exam  Ortho Exam Left hip exam is stable. Specialty Comments:  No specialty comments available.  Imaging: No results found.   PMFS History: Patient Active Problem List   Diagnosis Date Noted  . Systolic anterior movement of mitral valve 11/14/2018  . Paroxysmal SVT (supraventricular tachycardia) (HCC) 11/14/2018  . Essential hypertension 11/14/2018  . Palpitations 11/14/2018  . Preop cardiovascular exam 11/14/2018   Past Medical History:  Diagnosis Date  . Hep C w/ coma, chronic (HCC)   . High cholesterol   . Hypertension     Family History  Problem Relation Age of Onset  . Stomach cancer Mother   . Prostate cancer Father   . Breast cancer Sister   . Prostate cancer Brother   . Breast cancer Daughter   . Stomach cancer Daughter     Past Surgical History:  Procedure Laterality Date  . HYSTEROTOMY    . KNEE SURGERY     Social History   Occupational History  . Not on file  Tobacco Use  . Smoking status: Never Smoker  . Smokeless tobacco:  Never Used  Substance and Sexual Activity  . Alcohol use: No  . Drug use: No  . Sexual activity: Not on file

## 2018-12-27 ENCOUNTER — Ambulatory Visit: Payer: Medicare Other | Admitting: Cardiology

## 2019-03-13 ENCOUNTER — Telehealth: Payer: Self-pay | Admitting: Physical Medicine and Rehabilitation

## 2019-03-13 NOTE — Telephone Encounter (Signed)
Ok x 1

## 2019-03-14 NOTE — Telephone Encounter (Signed)
Scheduled for 03/22/2019 at 1315.

## 2019-03-22 ENCOUNTER — Encounter: Payer: Self-pay | Admitting: Physical Medicine and Rehabilitation

## 2019-03-22 ENCOUNTER — Other Ambulatory Visit: Payer: Self-pay

## 2019-03-22 ENCOUNTER — Ambulatory Visit (INDEPENDENT_AMBULATORY_CARE_PROVIDER_SITE_OTHER): Payer: Medicare Other | Admitting: Physical Medicine and Rehabilitation

## 2019-03-22 ENCOUNTER — Ambulatory Visit: Payer: Self-pay

## 2019-03-22 DIAGNOSIS — M25552 Pain in left hip: Secondary | ICD-10-CM | POA: Diagnosis not present

## 2019-03-22 NOTE — Progress Notes (Signed)
   Caroline Ortiz - 85 y.o. female MRN [id number]  Date of birth: 1932/05/07  Office Visit Note: Visit Date: 03/22/2019 PCP: Leanna Battles, MD Referred by: Leanna Battles, MD  Subjective: Chief Complaint  Patient presents with  . Left Hip - Pain   HPI:  Caroline Ortiz is a 85 y.o. female who comes in today For planned repeat left intra-articular hip anesthetic arthrogram.  Patient has known end-stage osteoarthritis of the left hip.  Prior injections have been helpful.  Patient had recent follow-up with Dr. Erlinda Hong with discussions about the problem with hip replacement given her age etc.  She is to follow-up with him as needed.  Depending on how the injection helps obviously we could repeat these probably on the basis of 3 times a year although toward a depends on how the patient is doing.  80% relief for the last injection performed in February.  ROS Otherwise per HPI.  Assessment & Plan: Visit Diagnoses:  1. Pain in left hip     Plan: No additional findings.   Meds & Orders: No orders of the defined types were placed in this encounter.   Orders Placed This Encounter  Procedures  . Large Joint Inj: L hip joint  . XR C-ARM NO REPORT    Follow-up: No follow-ups on file.   Procedures: Large Joint Inj: L hip joint on 03/22/2019 1:34 PM Indications: pain and diagnostic evaluation Details: 22 G needle, anterior approach  Arthrogram: Yes  Medications: 80 mg triamcinolone acetonide 40 MG/ML; 4 mL bupivacaine 0.25 % Outcome: tolerated well, no immediate complications  Arthrogram demonstrated excellent flow of contrast throughout the joint surface without extravasation or obvious defect.  The patient had relief of symptoms during the anesthetic phase of the injection.  Procedure, treatment alternatives, risks and benefits explained, specific risks discussed. Consent was given by the patient. Immediately prior to procedure a time out was called to verify the correct patient,  procedure, equipment, support staff and site/side marked as required. Patient was prepped and draped in the usual sterile fashion.      No notes on file   Clinical History: No specialty comments available.     Objective:  VS:  HT:    WT:   BMI:     BP:   HR: bpm  TEMP: ( )  RESP:  Physical Exam  Ortho Exam Imaging: Xr C-arm No Report  Result Date: 03/22/2019 Please see Notes tab for imaging impression.

## 2019-03-22 NOTE — Progress Notes (Signed)
 .  Numeric Pain Rating Scale and Functional Assessment Average Pain 10   In the last MONTH (on 0-10 scale) has pain interfered with the following?  1. General activity like being  able to carry out your everyday physical activities such as walking, climbing stairs, carrying groceries, or moving a chair?  Rating(5)   -Dye Allergies.  

## 2019-03-23 MED ORDER — BUPIVACAINE HCL 0.25 % IJ SOLN
4.0000 mL | INTRAMUSCULAR | Status: AC | PRN
  Administered 2019-03-22: 14:00:00 4 mL via INTRA_ARTICULAR

## 2019-03-23 MED ORDER — TRIAMCINOLONE ACETONIDE 40 MG/ML IJ SUSP
80.0000 mg | INTRAMUSCULAR | Status: AC | PRN
  Administered 2019-03-22: 80 mg via INTRA_ARTICULAR

## 2019-05-02 ENCOUNTER — Other Ambulatory Visit: Payer: Self-pay | Admitting: *Deleted

## 2019-05-02 DIAGNOSIS — Z20822 Contact with and (suspected) exposure to covid-19: Secondary | ICD-10-CM

## 2019-05-04 LAB — NOVEL CORONAVIRUS, NAA: SARS-CoV-2, NAA: NOT DETECTED

## 2019-05-08 ENCOUNTER — Other Ambulatory Visit: Payer: Self-pay | Admitting: Cardiology

## 2019-05-08 NOTE — Telephone Encounter (Signed)
Pt's daughter calling needing someone to call back concerning the pt, to see if pt takes something for Anxiety. I called daughter back and left message asking daughter to give our office a call back concerning this matter.

## 2019-05-08 NOTE — Telephone Encounter (Signed)
  Daughter is returning call 

## 2019-05-08 NOTE — Telephone Encounter (Signed)
Spoke with pt dtr, aware there is a listing in her chart for anxiety medication but the patient did not know the name and she would need to contact her medical doctor.

## 2019-05-10 ENCOUNTER — Telehealth: Payer: Self-pay | Admitting: Internal Medicine

## 2019-05-10 NOTE — Telephone Encounter (Signed)
Negative COVID results given. Patient results "NOT Detected." Caller expressed understanding. ° °

## 2019-05-11 ENCOUNTER — Telehealth: Payer: Self-pay | Admitting: Physical Medicine and Rehabilitation

## 2019-05-12 NOTE — Telephone Encounter (Signed)
Would see him first since it did not last long, if he says can repeat we can do

## 2019-05-12 NOTE — Telephone Encounter (Signed)
Scheduled for an OV with Dr. Erlinda Hong.

## 2019-05-16 ENCOUNTER — Encounter: Payer: Self-pay | Admitting: Orthopaedic Surgery

## 2019-05-16 ENCOUNTER — Ambulatory Visit (INDEPENDENT_AMBULATORY_CARE_PROVIDER_SITE_OTHER): Payer: Medicare Other | Admitting: Orthopaedic Surgery

## 2019-05-16 VITALS — Ht 66.0 in | Wt 135.0 lb

## 2019-05-16 DIAGNOSIS — M1612 Unilateral primary osteoarthritis, left hip: Secondary | ICD-10-CM | POA: Diagnosis not present

## 2019-05-16 NOTE — Progress Notes (Signed)
Subjective: Patient is here for ultrasound-guided intra-articular left hip injection.   End-stage DJD.  Objective:  Pain and decreased ROM with IR.  Procedure: Ultrasound-guided left hip injection: After sterile prep with Betadine, injected 8 cc 1% lidocaine without epinephrine and 40 mg methylprednisolone using a 22-gauge spinal needle, passing the needle through the iliofemoral ligament into the femoral head/neck junction.  Injectate was seen filling the joint capsule.  She had very good immediate pain relief.  Follow-up as directed.

## 2019-05-16 NOTE — Progress Notes (Signed)
Office Visit Note   Patient: Caroline Ortiz           Date of Birth: 11-21-1931           MRN: [id number] Visit Date: 05/16/2019              Requested by: Leanna Battles, MD [address] [place],  [place] [address] PCP: Leanna Battles, MD   Assessment & Plan: Visit Diagnoses:  1. Primary osteoarthritis of left hip     Plan: Impression is end-stage left hip degenerative joint disease.  Unfortunately she is suffering quite a bit from this and has taken a physical toll on her.  However her chronic liver disease and pancytopenia increases her risk for an elective total hip placement.  Ultimately she will have to decide if an improvement in quality of life is worth the potential risks of the surgery.  Rehab and risks were reviewed today.  Fortunately she has a great family support system around her.  She will take time to think about this and discuss further with her family.  She will likely reengage Dr. Michail Sermon on further discussion denies to how risky it would be to have the hip replacement. Total face to face encounter time was greater than 25 minutes and over half of this time was spent in counseling and/or coordination of care.  Follow-Up Instructions: Return if symptoms worsen or fail to improve.   Orders:  No orders of the defined types were placed in this encounter.  No orders of the defined types were placed in this encounter.     Procedures: No procedures performed   Clinical Data: No additional findings.   Subjective: Chief Complaint  Patient presents with  . Left Hip - Pain    Caroline Ortiz returns today for severe left hip pain.  Previous cortisone injection was about 6 weeks ago and it only worked for about 5 weeks.  She is in severe pain.  She is very physically debilitated due to this.  She is unsure if she wants to have a hip replacement for this mainly due to her age and her chronic liver disease and pancytopenia.   Review of Systems   Objective:  Vital Signs: Ht 5\' 6"  (1.676 m)   Wt 135 lb (61.2 kg)   BMI 21.79 kg/m   Physical Exam  Ortho Exam Left hip exam is unchanged.  Painful range of motion. Specialty Comments:  No specialty comments available.  Imaging: No results found.   PMFS History: Patient Active Problem List   Diagnosis Date Noted  . Primary osteoarthritis of left hip 05/16/2019  . Systolic anterior movement of mitral valve 11/14/2018  . Paroxysmal SVT (supraventricular tachycardia) ([facility]) 11/14/2018  . Essential hypertension 11/14/2018  . Palpitations 11/14/2018  . Preop cardiovascular exam 11/14/2018   Past Medical History:  Diagnosis Date  . Hep C w/ coma, chronic ([facility])   . High cholesterol   . Hypertension     Family History  Problem Relation Age of Onset  . Stomach cancer Mother   . Prostate cancer Father   . Breast cancer Sister   . Prostate cancer Brother   . Breast cancer Daughter   . Stomach cancer Daughter     Past Surgical History:  Procedure Laterality Date  . HYSTEROTOMY    . KNEE SURGERY     Social History   Occupational History  . Not on file  Tobacco Use  . Smoking status: Never Smoker  . Smokeless tobacco: Never  Used  Substance and Sexual Activity  . Alcohol use: No  . Drug use: No  . Sexual activity: Not on file

## 2019-06-13 ENCOUNTER — Encounter: Payer: Self-pay | Admitting: Cardiology

## 2019-06-13 ENCOUNTER — Other Ambulatory Visit: Payer: Self-pay

## 2019-06-13 ENCOUNTER — Ambulatory Visit (INDEPENDENT_AMBULATORY_CARE_PROVIDER_SITE_OTHER): Payer: Medicare Other | Admitting: Cardiology

## 2019-06-13 VITALS — BP 144/68 | HR 94 | Temp 96.9°F | Ht 66.0 in | Wt 137.0 lb

## 2019-06-13 DIAGNOSIS — I10 Essential (primary) hypertension: Secondary | ICD-10-CM | POA: Diagnosis not present

## 2019-06-13 DIAGNOSIS — I3489 Other nonrheumatic mitral valve disorders: Secondary | ICD-10-CM

## 2019-06-13 DIAGNOSIS — I348 Other nonrheumatic mitral valve disorders: Secondary | ICD-10-CM | POA: Diagnosis not present

## 2019-06-13 DIAGNOSIS — I471 Supraventricular tachycardia: Secondary | ICD-10-CM

## 2019-06-13 DIAGNOSIS — R42 Dizziness and giddiness: Secondary | ICD-10-CM | POA: Diagnosis not present

## 2019-06-13 NOTE — Progress Notes (Signed)
Cardiology Office Note:    Date:  06/13/2019   ID:  Caroline Ortiz, DOB Jan 06, 1932, MRN [id number]  PCP:  Jarome Matin, MD  Cardiologist:  Jodelle Red, MD PhD  Referring MD: Jarome Matin, MD   CC: follow up  History of Present Illness:    Caroline Ortiz is a 85 y.o. female with a hx of hypertension, hyperlipidemia, hepatitis C who is seen for follow up today.  Cardiac history: palpitations, present for many years, occurring every few days. Last seconds to minutes. Associated with shortness of breath, lightheadedness. No recent syncope.   Today Her today with her other daughter. Main issue is intermittent "panic attacks," few times every month, at most 1-2/week. Gets a wave of lightheadedness, can't breathe. Better when she sits and rests, take some deep breaths. No clear triggers. No syncope. No chest pain with it. Thinks she might have palpitations as well, but difficult to describe.  Trying to hold off on hip surgery. Senior exercise classes have been cancelled. Trying to stay active at home.   Denies chest pain, shortness of breath at rest or with normal exertion. No PND, orthopnea, LE edema or unexpected weight gain. No syncope or palpitations.  Past Medical History:  Diagnosis Date  . Hep C w/ coma, chronic (HCC)   . High cholesterol   . Hypertension     Past Surgical History:  Procedure Laterality Date  . HYSTEROTOMY    . KNEE SURGERY      Current Medications: Current Outpatient Medications on File Prior to Visit  Medication Sig  . acetaminophen (TYLENOL 8 HOUR ARTHRITIS PAIN) 650 MG CR tablet Take 1,300 mg by mouth every 8 (eight) hours as needed for pain.  Marland Kitchen amLODipine (NORVASC) 2.5 MG tablet Take 2 tablets (5 mg total) by mouth daily.  . cyclobenzaprine (FLEXERIL) 5 MG tablet Take 5 mg by mouth 3 (three) times daily as needed for muscle spasms.  Marland Kitchen gabapentin (NEURONTIN) 300 MG capsule Take 300 mg by mouth 2 (two) times daily.   Marland Kitchen  HYDROcodone-acetaminophen (NORCO/VICODIN) 5-325 MG per tablet Take 2 tablets by mouth every 4 (four) hours as needed.  Marland Kitchen lisinopril-hydrochlorothiazide (ZESTORETIC) 20-12.5 MG tablet Take 1 tablet by mouth daily.  . metoprolol (LOPRESSOR) 50 MG tablet Take 0.5 tablets (25 mg total) by mouth daily. (Patient taking differently: Take 50 mg by mouth daily. )  . Multiple Vitamin (MULTIVITAMIN WITH MINERALS) TABS tablet Take 1 tablet by mouth daily.  . naproxen (NAPROSYN) 500 MG tablet Take 1 tablet (500 mg total) by mouth 2 (two) times daily with a meal.  . PRESCRIPTION MEDICATION Take 1 tablet by mouth daily as needed (anxiety). Anxiety medication but did not have the bottle with her   No current facility-administered medications on file prior to visit.      Allergies:   Penicillins   Social History   Socioeconomic History  . Marital status: Married    Spouse name: Not on file  . Number of children: Not on file  . Years of education: Not on file  . Highest education level: Not on file  Occupational History  . Not on file  Social Needs  . Financial resource strain: Not on file  . Food insecurity    Worry: Not on file    Inability: Not on file  . Transportation needs    Medical: Not on file    Non-medical: Not on file  Tobacco Use  . Smoking status: Never Smoker  . Smokeless tobacco:  Never Used  Substance and Sexual Activity  . Alcohol use: No  . Drug use: No  . Sexual activity: Not on file  Lifestyle  . Physical activity    Days per week: Not on file    Minutes per session: Not on file  . Stress: Not on file  Relationships  . Social Musician on phone: Not on file    Gets together: Not on file    Attends religious service: Not on file    Active member of club or organization: Not on file    Attends meetings of clubs or organizations: Not on file    Relationship status: Not on file  Other Topics Concern  . Not on file  Social History Narrative  . Not on file    Married, lives with husband. No alcohol, no tobacco. Does senior exercise classes 2 days/week for 45 min each.   Family History: The patient's family history includes Breast cancer in her daughter and sister; Prostate cancer in her brother and father; Stomach cancer in her daughter and mother.  ROS:   Please see the history of present illness.  Additional pertinent ROS: Constitutional: Negative for chills, fever, night sweats, unintentional weight loss  HENT: Negative for ear pain and hearing loss.   Eyes: Negative for loss of vision and eye pain.  Respiratory: Negative for cough, sputum, wheezing.   Cardiovascular: See HPI. Gastrointestinal: Negative for abdominal pain, melena, and hematochezia.  Genitourinary: Negative for dysuria and hematuria.  Musculoskeletal: Negative for falls and myalgias.  Skin: Negative for itching and rash.  Neurological: Negative for focal weakness, focal sensory changes and loss of consciousness.  Endo/Heme/Allergies: Does not bruise/bleed easily.    EKGs/Labs/Other Studies Reviewed:    The following studies were reviewed today: Monitor 10/24/18 ~7 days of data recorded on Zio monitor. No VT, atrial fibrillation, high degree block, or pauses noted. Isolated ventricular ectopy was rare (<1%), and isolated atrial ectopy occurred 1.1% of all beats. There were 0 patient triggered events.   Patient had a min HR of 50 bpm, max HR of 203 bpm, and avg HR of 68 bpm. Predominant underlying rhythm was Sinus Rhythm. 16 Supraventricular Tachycardia runs occurred, the run with the fastest interval lasting 17 beats with a max rate of 203 bpm, the longest lasting 13 beats with an avg rate of 93 bpm. Ventricular Bigeminy and Trigeminy were rare.  Echo 10/05/18 Left ventricle: The cavity size was normal. Wall thickness was   increased in a pattern of moderate LVH. Systolic function was   vigorous. The estimated ejection fraction was in the range of 65%   to 70%. Wall  motion was normal; there were no regional wall   motion abnormalities. Doppler parameters are consistent with   abnormal left ventricular relaxation (grade 1 diastolic   dysfunction). - Aortic valve: Trileaflet; moderately thickened, mildly calcified   leaflets. Transvalvular velocity was within the normal range.   There was no stenosis. There was trivial regurgitation. - Mitral valve: Severely calcified annulus. Systolic anterior   motion of the mitral valve. There was mild, late systolic   regurgitation. Mean gradient (D): 1 mm Hg (HR 66 bpm). - Left atrium: The atrium was severely dilated. - Tricuspid valve: There was mild regurgitation.  Impressions: - Hyperdynamic LV function with systolic anterior motion of the   mitral valve and mild late systolic mitral regurgitation.   Angulation of the basal septum, without significant color flow   acceleration. This study  did not detect a significant LVOT   obstructive gradient.     Severe mitral annular calcification of the posterior annulus with   no significant stenosis at resting heart rate.  EKG:  EKG is personally reviewed.  The ekg ordered 09/27/18 demonstrates sinus bradycardia  Recent Labs: 09/21/2018: BUN 11; Creatinine, Ser 0.74; Hemoglobin 11.7; Platelets 94; Potassium 3.3; Sodium 141  Recent Lipid Panel No results found for: CHOL, TRIG, HDL, CHOLHDL, VLDL, LDLCALC, LDLDIRECT  Physical Exam:    VS:  BP (!) 144/68   Pulse 94   Temp (!) 96.9 F (36.1 C)   Ht 5\' 6"  (1.676 m)   Wt 137 lb (62.1 kg)   SpO2 98%   BMI 22.11 kg/m     Wt Readings from Last 3 Encounters:  06/13/19 137 lb (62.1 kg)  05/16/19 135 lb (61.2 kg)  11/29/18 135 lb (61.2 kg)    GEN: Frail appearing elderly woman in no acute distress HEENT: Normal, moist mucous membranes NECK: No JVD CARDIAC: regular rhythm, normal S1 and S2, no rubs, gallops. 1/6 SM VASCULAR: Radial and DP pulses 2+ bilaterally. No carotid bruits RESPIRATORY:  Clear to  auscultation without rales, wheezing or rhonchi  ABDOMEN: Soft, non-tender, non-distended MUSCULOSKELETAL:  Ambulates independently though slowly, uses cane SKIN: Warm and dry, no edema NEUROLOGIC:  Alert and oriented x 3. No focal neuro deficits noted. PSYCHIATRIC:  Normal affect   ASSESSMENT:    1. Spell of dizziness   2. Paroxysmal SVT (supraventricular tachycardia) ([place])   3. Essential hypertension   4. Systolic anterior movement of mitral valve    PLAN:    Palpitations: have been intermittent for many years. Not clear that these are the same as her "panic attacks," but happening infrequently -TSH normal, electrolytes WNL on last labs -event monitor 10/2018 with brief episodes of SVT, no high risk arrhythmias seen -for paroxysmal SVT, continue metoprolol -will call us if becoming more frequent, and we can re-evaluate  Basal septal hypertrophy with systolic anterior motion of the mitral valve and late systolic MR: No significant LVOT gradient on echo -continue metoprolol, comments noted below -avoid dehydration -may not tolerate tachycardia well  Hypertension: slightly elevated today, does not take at home -on amlodipine 5 mg, lisinopril-HCTZ 20-12.5 mg, metoprolol tartrate 50 mg daily -as discussed previously, would recommend changing to 25 mg BID tartrate or switching to 50 mg metoprolol succinate daily as tartrate is not a long acting form. She receives this from Dr. Philip Aspen, will send my note with recommendations. -with lightheaded spells, would avoid hypotension  Plan for follow up: 6 mos or sooner PRN  Medication Adjustments/Labs and Tests Ordered: Current medicines are reviewed at length with the patient today.  Concerns regarding medicines are outlined above.  No orders of the defined types were placed in this encounter.  No orders of the defined types were placed in this encounter.   Patient Instructions  Medication Instructions:  Your Physician recommend you  continue on your current medication as directed.    If you need a refill on your cardiac medications before your next appointment, please call your pharmacy.   Lab work: None  Testing/Procedures: None  Follow-Up: At [facility], you and your health needs are our priority.  As part of our continuing mission to provide you with exceptional heart care, we have created designated Provider Care Teams.  These Care Teams include your primary Cardiologist (physician) and Advanced Practice Providers (APPs -  Physician Assistants and Nurse Practitioners) who all  work together to provide you with the care you need, when you need it. You will need a follow up appointment in 6 months.  Please call our office 2 months in advance to schedule this appointment.  You may see Jodelle RedBridgette Kanna Dafoe, MD or one of the following Advanced Practice Providers on your designated Care Team:   Theodore DemarkRhonda Barrett, PA-C . Joni ReiningKathryn Lawrence, DNP, ANP        Signed, Jodelle RedBridgette Kennedy Bohanon, MD PhD 06/13/2019 2:41 PM    [facility]

## 2019-06-13 NOTE — Patient Instructions (Signed)

## 2019-06-28 ENCOUNTER — Ambulatory Visit: Payer: Self-pay

## 2019-06-28 ENCOUNTER — Encounter: Payer: Self-pay | Admitting: Orthopaedic Surgery

## 2019-06-28 ENCOUNTER — Other Ambulatory Visit: Payer: Self-pay

## 2019-06-28 ENCOUNTER — Ambulatory Visit (INDEPENDENT_AMBULATORY_CARE_PROVIDER_SITE_OTHER): Payer: Medicare Other | Admitting: Orthopaedic Surgery

## 2019-06-28 DIAGNOSIS — M1612 Unilateral primary osteoarthritis, left hip: Secondary | ICD-10-CM

## 2019-06-28 NOTE — Progress Notes (Signed)
Office Visit Note   Patient: Caroline Ortiz           Date of Birth: [birth year]           MRN: [id number] Visit Date: 06/28/2019              Requested by: Jarome Matin, MD [address] [place],  [place] [address] PCP: Jarome Matin, MD   Assessment & Plan: Visit Diagnoses:  1. Primary osteoarthritis of left hip     Plan: Impression is end-stage left hip DJD with protrusio.  At this point patient realizes that the pain will not get any better with continued nonsurgical treatment.  She wants to remain active but she is unable to do so due to her condition.  Therefore she is willing to proceed with a total hip replacement.  Risks and benefits and possible complications were reviewed today with her and daughter.  Realistic expectations for postoperative rehab were discussed today.  We will obtain preoperative clearance from her PCP Dr. Jarold Motto, hepatologist Dr. Bosie Clos, cardiologist Dr. Cristal Deer.  Follow-Up Instructions: Return if symptoms worsen or fail to improve.   Orders:  Orders Placed This Encounter  Procedures  . XR HIP UNILAT W OR W/O PELVIS 2-3 VIEWS LEFT   No orders of the defined types were placed in this encounter.     Procedures: No procedures performed   Clinical Data: No additional findings.   Subjective: Chief Complaint  Patient presents with  . Left Hip - Pain, Follow-up    Conor returns today for continued chronic left hip pain.  She is accompanied by her daughter.  She ambulates with a cane.  She has declined even more in terms of activity secondary to the pain.  She now wants to explore total hip replacement.   Review of Systems  Constitutional: Negative.   HENT: Negative.   Eyes: Negative.   Respiratory: Negative.   Cardiovascular: Negative.   Endocrine: Negative.   Musculoskeletal: Negative.   Neurological: Negative.   Hematological: Negative.   Psychiatric/Behavioral: Negative.   All other systems reviewed and are  negative.    Objective: Vital Signs: There were no vitals taken for this visit.  Physical Exam Vitals signs and nursing note reviewed.  Constitutional:      Appearance: She is well-developed.  Pulmonary:     Effort: Pulmonary effort is normal.  Skin:    General: Skin is warm.     Capillary Refill: Capillary refill takes less than 2 seconds.  Neurological:     Mental Status: She is alert and oriented to person, place, and time.  Psychiatric:        Behavior: Behavior normal.        Thought Content: Thought content normal.        Judgment: Judgment normal.     Ortho Exam Left hip exam is unchanged. Specialty Comments:  No specialty comments available.  Imaging: Xr Hip Unilat W Or W/o Pelvis 2-3 Views Left  Result Date: 06/28/2019 End stage DJD with protrusio.    PMFS History: Patient Active Problem List   Diagnosis Date Noted  . Primary osteoarthritis of left hip 05/16/2019  . Systolic anterior movement of mitral valve 11/14/2018  . Paroxysmal SVT (supraventricular tachycardia) (HCC) 11/14/2018  . Essential hypertension 11/14/2018  . Palpitations 11/14/2018  . Preop cardiovascular exam 11/14/2018   Past Medical History:  Diagnosis Date  . Hep C w/ coma, chronic (HCC)   . High cholesterol   . Hypertension  Family History  Problem Relation Age of Onset  . Stomach cancer Mother   . Prostate cancer Father   . Breast cancer Sister   . Prostate cancer Brother   . Breast cancer Daughter   . Stomach cancer Daughter     Past Surgical History:  Procedure Laterality Date  . HYSTEROTOMY    . KNEE SURGERY     Social History   Occupational History  . Not on file  Tobacco Use  . Smoking status: Never Smoker  . Smokeless tobacco: Never Used  Substance and Sexual Activity  . Alcohol use: No  . Drug use: No  . Sexual activity: Not on file

## 2019-07-05 ENCOUNTER — Telehealth: Payer: Self-pay

## 2019-07-05 ENCOUNTER — Telehealth: Payer: Self-pay | Admitting: Cardiology

## 2019-07-05 NOTE — Telephone Encounter (Signed)
   Primary Cardiologist: Buford Dresser, MD  Chart reviewed as part of pre-operative protocol coverage. Patient was contacted 07/05/2019 in reference to pre-operative risk assessment for pending surgery as outlined below.  Caroline Ortiz was last seen on 06/13/2019 by Dr. Harrell Gave.  Since that day, Caroline Ortiz has done well without any chest pain or shortness of breath. Functional ability is limited due to hip pain.  Therefore, based on ACC/AHA guidelines, the patient would be at acceptable risk for the planned procedure without further cardiovascular testing.   I will route this recommendation to the requesting party via Epic fax function and remove from pre-op pool.  Please call with questions.  [place], [place] 07/05/2019, 2:42 PM

## 2019-07-05 NOTE — Telephone Encounter (Signed)
New message      Returning a call to the pre-op nurse regarding clearance.

## 2019-07-05 NOTE — Telephone Encounter (Signed)
   [facility] Pre-operative Risk Assessment    Request for surgical clearance:  1. What type of surgery is being performed? LEFT TOTAL HIP ARTHROPLASTY   2. When is this surgery scheduled? TBD   3. What type of clearance is required (medical clearance vs. Pharmacy clearance to hold med vs. Both)? MEDICAL  4. Are there any medications that need to be held prior to surgery and how long? NONE   5. Practice name and name of physician performing surgery? [facility]  ATTN: SHERRIE   6. What is your office phone number [phone]    7.   What is your office fax number  [phone]  8.   Anesthesia type (None, local, MAC, general) ? SPINAL

## 2019-07-05 NOTE — Telephone Encounter (Signed)
Note, patient was cleared earlier this year for the same procedure. I plan to call the patient, if no significant symptom, will clear her again.   Left message for her to call back and speak to the on call preop APP

## 2019-07-13 ENCOUNTER — Telehealth: Payer: Self-pay

## 2019-07-13 NOTE — Telephone Encounter (Signed)
I spoke with daughter, Janae Bridgeman, and advised that Dr. Michail Sermon would not clear patient for total hip arthroplasty due to liver.  Daughter is asking if patient can have another hip injection.  Please call [phone].

## 2019-07-20 NOTE — Telephone Encounter (Signed)
ok 

## 2019-07-20 NOTE — Telephone Encounter (Signed)
Please see message below

## 2019-07-20 NOTE — Telephone Encounter (Signed)
Pt is scheduled 08/03/2019

## 2019-07-20 NOTE — Telephone Encounter (Signed)
Called pt daughter and she states pt had more than 50% of relief from last injection on 03/22/2019. Pt would like to repeat injection. Ok to proceed.

## 2019-08-03 ENCOUNTER — Ambulatory Visit: Payer: Self-pay

## 2019-08-03 ENCOUNTER — Encounter: Payer: Self-pay | Admitting: Physical Medicine and Rehabilitation

## 2019-08-03 ENCOUNTER — Other Ambulatory Visit: Payer: Self-pay

## 2019-08-03 ENCOUNTER — Ambulatory Visit (INDEPENDENT_AMBULATORY_CARE_PROVIDER_SITE_OTHER): Payer: Medicare Other | Admitting: Physical Medicine and Rehabilitation

## 2019-08-03 DIAGNOSIS — M25552 Pain in left hip: Secondary | ICD-10-CM

## 2019-08-03 MED ORDER — BUPIVACAINE HCL 0.25 % IJ SOLN
4.0000 mL | INTRAMUSCULAR | Status: AC | PRN
  Administered 2019-08-03: 4 mL via INTRA_ARTICULAR

## 2019-08-03 MED ORDER — TRIAMCINOLONE ACETONIDE 40 MG/ML IJ SUSP
80.0000 mg | INTRAMUSCULAR | Status: AC | PRN
  Administered 2019-08-03: 80 mg via INTRA_ARTICULAR

## 2019-08-03 NOTE — Progress Notes (Signed)
   Caroline Ortiz - 85 y.o. female MRN [id number]  Date of birth: [birth year]  Office Visit Note: Visit Date: 08/03/2019 PCP: Leanna Battles, MD Referred by: Leanna Battles, MD  Subjective: Chief Complaint  Patient presents with  . Left Hip - Pain   HPI:  Caroline Ortiz is a 85 y.o. female who comes in today For repeat left intra-articular hip injection.  Patient was set to have hip replacement by Dr. Eduard Roux but she says that she was declined to have this because of hepatitis.  She does have end-stage osteoarthritis of the hip.  We will go ahead and repeat the injection today.  She does have her last injection she had numbness down to the big toe and L5 distribution.  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  I tried to explain to her the injection in the hip area is not known to cause any numbness to the toe especially persistent.  I think she is probably having some radicular component from L4-5.  Be happy to see her for that in the future if she needed.  She will continue to follow with Dr. Eduard Roux.  ROS Otherwise per HPI.  Assessment & Plan: Visit Diagnoses:  1. Pain in left hip     Plan: No additional findings.   Meds & Orders: No orders of the defined types were placed in this encounter.   Orders Placed This Encounter  Procedures  . XR C-ARM NO REPORT    Follow-up: No follow-ups on file.   Procedures: Large Joint Inj: L hip joint on 08/03/2019 8:56 AM Indications: diagnostic evaluation and pain Details: 22 G 3.5 in needle, fluoroscopy-guided anterior approach  Arthrogram: No  Medications: 80 mg triamcinolone acetonide 40 MG/ML; 4 mL bupivacaine 0.25 % Outcome: tolerated well, no immediate complications  There was excellent flow of contrast producing a partial arthrogram of the hip. The patient did have relief of symptoms during the anesthetic phase of the injection. Procedure,  treatment alternatives, risks and benefits explained, specific risks discussed. Consent was given by the patient. Immediately prior to procedure a time out was called to verify the correct patient, procedure, equipment, support staff and site/side marked as required. Patient was prepped and draped in the usual sterile fashion.      No notes on file   Clinical History: No specialty comments available.     Objective:  VS:  HT:    WT:   BMI:     BP:   HR: bpm  TEMP: ( )  RESP:  Physical Exam  Ortho Exam Imaging: No results found.

## 2019-08-03 NOTE — Progress Notes (Signed)
 .  Numeric Pain Rating Scale and Functional Assessment Average Pain 8   In the last MONTH (on 0-10 scale) has pain interfered with the following?  1. General activity like being  able to carry out your everyday physical activities such as walking, climbing stairs, carrying groceries, or moving a chair?  Rating(8)   +Driver, -BT, -Dye Allergies.  

## 2019-11-02 ENCOUNTER — Telehealth: Payer: Self-pay | Admitting: *Deleted

## 2019-11-02 NOTE — Telephone Encounter (Signed)
A message was left, re: her follow up visit. 

## 2019-12-18 ENCOUNTER — Ambulatory Visit (INDEPENDENT_AMBULATORY_CARE_PROVIDER_SITE_OTHER): Payer: Medicare Other | Admitting: Cardiology

## 2019-12-18 ENCOUNTER — Other Ambulatory Visit: Payer: Self-pay

## 2019-12-18 ENCOUNTER — Encounter: Payer: Self-pay | Admitting: Cardiology

## 2019-12-18 VITALS — BP 138/74 | HR 64 | Ht 66.0 in | Wt 138.2 lb

## 2019-12-18 DIAGNOSIS — I3489 Other nonrheumatic mitral valve disorders: Secondary | ICD-10-CM

## 2019-12-18 DIAGNOSIS — R002 Palpitations: Secondary | ICD-10-CM | POA: Diagnosis not present

## 2019-12-18 DIAGNOSIS — I471 Supraventricular tachycardia: Secondary | ICD-10-CM

## 2019-12-18 DIAGNOSIS — I348 Other nonrheumatic mitral valve disorders: Secondary | ICD-10-CM | POA: Diagnosis not present

## 2019-12-18 DIAGNOSIS — I10 Essential (primary) hypertension: Secondary | ICD-10-CM

## 2019-12-18 NOTE — Progress Notes (Signed)
Cardiology Office Note:    Date:  12/18/2019   ID:  Caroline Ortiz, DOB 10-09-1931, MRN [id number]  PCP:  Leanna Battles, MD  Cardiologist:  Buford Dresser, MD PhD  Referring MD: Leanna Battles, MD   CC: follow up  History of Present Illness:    Caroline Ortiz is a 86 y.o. female with a hx of hypertension, hyperlipidemia, hepatitis C who is seen for follow up today.  Cardiac history: palpitations, present for many years, occurring every few days. Last seconds to minutes. Associated with shortness of breath, lightheadedness. No recent syncope.   Today: Had 10 minutes of palpitations while in the kitchen washing dishes, made it to the bed and laid down, felt better. Happening at least 2-3 times/week.   Taking metoprolol, not sure if tartrate or succinate, not sure of dose. Doesn't check BP at home. Not taking lisinopril-HCTZ  Has had Covid vaccines. Has a lot of hip pain, limits her ambulation.   Denies chest pain, shortness of breath at rest or with normal exertion. No PND, orthopnea, LE edema or unexpected weight gain. No syncope.  Past Medical History:  Diagnosis Date  . Hep C w/ coma, chronic ([place])   . High cholesterol   . Hypertension     Past Surgical History:  Procedure Laterality Date  . HYSTEROTOMY    . KNEE SURGERY      Current Medications: Current Outpatient Medications on File Prior to Visit  Medication Sig  . acetaminophen (TYLENOL 8 HOUR ARTHRITIS PAIN) 650 MG CR tablet Take 1,300 mg by mouth every 8 (eight) hours as needed for pain.  Marland Kitchen ALPRAZolam (XANAX) 0.25 MG tablet Take 0.25 mg by mouth 2 (two) times daily as needed.  Marland Kitchen amLODipine (NORVASC) 2.5 MG tablet Take 2 tablets (5 mg total) by mouth daily.  . cyclobenzaprine (FLEXERIL) 5 MG tablet Take 5 mg by mouth 3 (three) times daily as needed for muscle spasms.  Marland Kitchen gabapentin (NEURONTIN) 300 MG capsule Take 300 mg by mouth 2 (two) times daily.   . hydrochlorothiazide (MICROZIDE) 12.5 MG  capsule Take 12.5 mg by mouth daily.  Marland Kitchen HYDROcodone-acetaminophen (NORCO/VICODIN) 5-325 MG per tablet Take 2 tablets by mouth every 4 (four) hours as needed.  Marland Kitchen lisinopril (ZESTRIL) 10 MG tablet Take 10 mg by mouth daily.  . metoprolol (LOPRESSOR) 50 MG tablet Take 0.5 tablets (25 mg total) by mouth daily. (Patient taking differently: Take 50 mg by mouth daily. )  . Multiple Vitamin (MULTIVITAMIN WITH MINERALS) TABS tablet Take 1 tablet by mouth daily.  . naproxen (NAPROSYN) 500 MG tablet Take 1 tablet (500 mg total) by mouth 2 (two) times daily with a meal.  . PRESCRIPTION MEDICATION Take 1 tablet by mouth daily as needed (anxiety). Anxiety medication but did not have the bottle with her   No current facility-administered medications on file prior to visit.     Allergies:   Penicillins   Social History   Tobacco Use  . Smoking status: Never Smoker  . Smokeless tobacco: Never Used  Substance Use Topics  . Alcohol use: No  . Drug use: No  Married, lives with husband. No alcohol, no tobacco. Does senior exercise classes 2 days/week for 45 min each.   Family History: The patient's family history includes Breast cancer in her daughter and sister; Prostate cancer in her brother and father; Stomach cancer in her daughter and mother.  ROS:   Please see the history of present illness.  Additional pertinent ROS otherwise unremarkable.  EKGs/Labs/Other Studies Reviewed:    The following studies were reviewed today: Monitor 10/24/18 ~7 days of data recorded on Zio monitor. No VT, atrial fibrillation, high degree block, or pauses noted. Isolated ventricular ectopy was rare (<1%), and isolated atrial ectopy occurred 1.1% of all beats. There were 0 patient triggered events.   Patient had a min HR of 50 bpm, max HR of 203 bpm, and avg HR of 68 bpm. Predominant underlying rhythm was Sinus Rhythm. 16 Supraventricular Tachycardia runs occurred, the run with the fastest interval lasting 17 beats with  a max rate of 203 bpm, the longest lasting 13 beats with an avg rate of 93 bpm. Ventricular Bigeminy and Trigeminy were rare.  Echo 10/05/18 Left ventricle: The cavity size was normal. Wall thickness was   increased in a pattern of moderate LVH. Systolic function was   vigorous. The estimated ejection fraction was in the range of 65%   to 70%. Wall motion was normal; there were no regional wall   motion abnormalities. Doppler parameters are consistent with   abnormal left ventricular relaxation (grade 1 diastolic   dysfunction). - Aortic valve: Trileaflet; moderately thickened, mildly calcified   leaflets. Transvalvular velocity was within the normal range.   There was no stenosis. There was trivial regurgitation. - Mitral valve: Severely calcified annulus. Systolic anterior   motion of the mitral valve. There was mild, late systolic   regurgitation. Mean gradient (D): 1 mm Hg (HR 66 bpm). - Left atrium: The atrium was severely dilated. - Tricuspid valve: There was mild regurgitation.  Impressions: - Hyperdynamic LV function with systolic anterior motion of the   mitral valve and mild late systolic mitral regurgitation.   Angulation of the basal septum, without significant color flow   acceleration. This study did not detect a significant LVOT   obstructive gradient.     Severe mitral annular calcification of the posterior annulus with   no significant stenosis at resting heart rate.  EKG:  EKG is personally reviewed.  The ekg ordered today demonstrates sinus rhythm with sinus arrhythmia, 1st degree AV block  Recent Labs: No results found for requested labs within last 8760 hours.  Recent Lipid Panel No results found for: CHOL, TRIG, HDL, CHOLHDL, VLDL, LDLCALC, LDLDIRECT  Physical Exam:    VS:  BP 138/74   Pulse 64   Ht 5\' 6"  (1.676 m)   Wt 138 lb 3.2 oz (62.7 kg)   SpO2 99%   BMI 22.31 kg/m     Wt Readings from Last 3 Encounters:  12/18/19 138 lb 3.2 oz (62.7 kg)    06/13/19 137 lb (62.1 kg)  05/16/19 135 lb (61.2 kg)    GEN: frail appearing elderly woman in no acute distress HEENT: Normal, moist mucous membranes NECK: No JVD CARDIAC: regular rhythm, normal S1 and S2, no rubs or gallops. 1/6 systolic murmur. VASCULAR: Radial pulses 2+ bilaterally. No carotid bruits. Faint palpable DP/PT pulses bilaterally. RESPIRATORY:  Clear to auscultation without rales, wheezing or rhonchi  ABDOMEN: Soft, non-tender, non-distended MUSCULOSKELETAL:  Ambulates independently with cane SKIN: Warm and dry, trivial LE edema NEUROLOGIC:  Alert and oriented x 3. No focal neuro deficits noted. PSYCHIATRIC:  Normal affect   ASSESSMENT:    No diagnosis found. PLAN:    Palpitations: discussed today. Reviewed monitor and prior workup again today. Occasional paroxysmal SVT. -instructed her to call 05/18/19 after the visit to let us know what dose of metoprolol she is taking and whether it is tartrate or  succinate. -TSH normal, electrolytes WNL on last labs -event monitor 10/2018 with brief episodes of SVT, no high risk arrhythmias seen -will call us if becoming more frequent, and we can re-evaluate -ECG today sinus rhythm with sinus arrhythmia  Basal septal hypertrophy with systolic anterior motion of the mitral valve and late systolic MR: No significant LVOT gradient on echo -continue metoprolol, comments noted below -avoid dehydration -may not tolerate tachycardia well  Hypertension: just above goal today. -does not measure home BP -on amlodipine 5 mg daily and metoprolol as above -reports she is not taking lisinopril-HCTZ 20-12.5 mg. She will confirm this on her pill bottles at home and call us to confirm whether or not she is taking this -followed by her PCP. -with lightheaded spells, would avoid hypotension  Plan for follow up: 6 mos or sooner PRN  Medication Adjustments/Labs and Tests Ordered: Current medicines are reviewed at length with the patient today.   Concerns regarding medicines are outlined above.  Orders Placed This Encounter  Procedures  . EKG 12-Lead   No orders of the defined types were placed in this encounter.   Patient Instructions  Medication Instructions:  Check at home: Look at metoprolol prescription. Is the second word tartrate (short acting) or succinate (long acting)? How many milligrams (mg) are you taking daily?  Are you still taking the lisinopril and hydrochlorothiazide?  *If you need a refill on your cardiac medications before your next appointment, please call your pharmacy*   Lab Work: None   Testing/Procedures: None   Follow-Up: At [facility], you and your health needs are our priority.  As part of our continuing mission to provide you with exceptional heart care, we have created designated Provider Care Teams.  These Care Teams include your primary Cardiologist (physician) and Advanced Practice Providers (APPs -  Physician Assistants and Nurse Practitioners) who all work together to provide you with the care you need, when you need it.  We recommend signing up for the patient portal called "MyChart".  Sign up information is provided on this After Visit Summary.  MyChart is used to connect with patients for Virtual Visits (Telemedicine).  Patients are able to view lab/test results, encounter notes, upcoming appointments, etc.  Non-urgent messages can be sent to your provider as well.   To learn more about what you can do with MyChart, go to [email or web address].    Your next appointment:   6 month(s)  The format for your next appointment:   In Person  Provider:   Jodelle Red, MD       Signed, Jodelle Red, MD PhD 12/18/2019  [facility]

## 2019-12-18 NOTE — Patient Instructions (Addendum)
Medication Instructions:  Check at home: Look at metoprolol prescription. Is the second word tartrate (short acting) or succinate (long acting)? How many milligrams (mg) are you taking daily?  Are you still taking the lisinopril and hydrochlorothiazide?  *If you need a refill on your cardiac medications before your next appointment, please call your pharmacy*   Lab Work: None   Testing/Procedures: None   Follow-Up: At [facility], you and your health needs are our priority.  As part of our continuing mission to provide you with exceptional heart care, we have created designated Provider Care Teams.  These Care Teams include your primary Cardiologist (physician) and Advanced Practice Providers (APPs -  Physician Assistants and Nurse Practitioners) who all work together to provide you with the care you need, when you need it.  We recommend signing up for the patient portal called "MyChart".  Sign up information is provided on this After Visit Summary.  MyChart is used to connect with patients for Virtual Visits (Telemedicine).  Patients are able to view lab/test results, encounter notes, upcoming appointments, etc.  Non-urgent messages can be sent to your provider as well.   To learn more about what you can do with MyChart, go to [email or web address].    Your next appointment:   6 month(s)  The format for your next appointment:   In Person  Provider:   Jodelle Red, MD

## 2020-01-22 ENCOUNTER — Other Ambulatory Visit: Payer: Self-pay | Admitting: *Deleted

## 2020-01-22 DIAGNOSIS — M79606 Pain in leg, unspecified: Secondary | ICD-10-CM

## 2020-01-24 ENCOUNTER — Telehealth (HOSPITAL_COMMUNITY): Payer: Self-pay

## 2020-01-24 NOTE — Telephone Encounter (Signed)
The above patient or their representative was contacted and gave the following answers to these questions:         Do you have any of the following symptoms?    NO  Fever                    Cough                   Shortness of breath  Do  you have any of the following other symptoms?    muscle pain         vomiting,        diarrhea        rash         weakness        red eye        abdominal pain         bruising          bruising or bleeding              joint pain           severe headache    Have you been in contact with someone who was or has been sick in the past 2 weeks?  NO  Yes                 Unsure                         Unable to assess   Does the person that you were in contact with have any of the following symptoms?   Cough         shortness of breath           muscle pain         vomiting,            diarrhea            rash            weakness           fever            red eye           abdominal pain           bruising  or  bleeding                joint pain                severe headache                 COMMENTS OR ACTION PLAN FOR THIS PATIENT:        ALL QUESTIONS WERE ANSWERED BY PATIENTS DAUGHTER AELENE/[facility]

## 2020-01-25 ENCOUNTER — Encounter: Payer: Self-pay | Admitting: Vascular Surgery

## 2020-01-25 ENCOUNTER — Ambulatory Visit (INDEPENDENT_AMBULATORY_CARE_PROVIDER_SITE_OTHER): Payer: Medicare Other | Admitting: Vascular Surgery

## 2020-01-25 ENCOUNTER — Ambulatory Visit (HOSPITAL_COMMUNITY)
Admission: RE | Admit: 2020-01-25 | Discharge: 2020-01-25 | Disposition: A | Discharge status: Home / Self Care | Payer: Medicare Other | Source: Ambulatory Visit | Attending: Vascular Surgery | Admitting: Vascular Surgery

## 2020-01-25 ENCOUNTER — Other Ambulatory Visit: Payer: Self-pay

## 2020-01-25 VITALS — BP 137/78 | HR 69 | Temp 97.3°F | Resp 20 | Ht 66.0 in | Wt 134.0 lb

## 2020-01-25 DIAGNOSIS — M79605 Pain in left leg: Secondary | ICD-10-CM | POA: Diagnosis not present

## 2020-01-25 DIAGNOSIS — M79606 Pain in leg, unspecified: Secondary | ICD-10-CM | POA: Diagnosis present

## 2020-01-25 NOTE — Progress Notes (Signed)
Referring Physician: Dr. Sharlett Iles  Patient name: Caroline Ortiz MRN: [id number] DOB: 10-13-1931 Sex: female  REASON FOR CONSULT: Left hip and leg pain  HPI: Caroline Ortiz is a 86 y.o. female, with a lengthy history of left hip and leg pain.  This is previously thought to be due to degenerative arthritis in her left hip.  Unfortunately she is not a candidate for total hip replacement secondary to cirrhosis from hepatitis C and her age.  She is able to get around with a cane or a walker.  She does not really describe claudication or rest pain in the leg.  Most of the pain is in the hip and this radiates down the left side of the leg.  She has no nonhealing wounds.  She has never smoked.  Other medical problems include pancytopenia, hepatitis C, elevated cholesterol.  These are all stable.  Past Medical History:  Diagnosis Date  . CHF (congestive heart failure) ([place])   . Hep C w/ coma, chronic ([place])   . High cholesterol   . Hypertension    Past Surgical History:  Procedure Laterality Date  . HYSTEROTOMY    . KNEE SURGERY      Family History  Problem Relation Age of Onset  . Stomach cancer Mother   . Prostate cancer Father   . Breast cancer Sister   . Prostate cancer Brother   . Breast cancer Daughter   . Stomach cancer Daughter     SOCIAL HISTORY: Social History   Socioeconomic History  . Marital status: Married    Spouse name: Not on file  . Number of children: Not on file  . Years of education: Not on file  . Highest education level: Not on file  Occupational History  . Not on file  Tobacco Use  . Smoking status: Never Smoker  . Smokeless tobacco: Never Used  Substance and Sexual Activity  . Alcohol use: No  . Drug use: No  . Sexual activity: Not on file  Other Topics Concern  . Not on file  Social History Narrative  . Not on file   Social Determinants of Health   Financial Resource Strain:   . Difficulty of Paying Living Expenses:   Food  Insecurity:   . Worried About Charity fundraiser in the Last Year:   . Arboriculturist in the Last Year:   Transportation Needs:   . Film/video editor (Medical):   Marland Kitchen Lack of Transportation (Non-Medical):   Physical Activity:   . Days of Exercise per Week:   . Minutes of Exercise per Session:   Stress:   . Feeling of Stress :   Social Connections:   . Frequency of Communication with Friends and Family:   . Frequency of Social Gatherings with Friends and Family:   . Attends Religious Services:   . Active Member of Clubs or Organizations:   . Attends Archivist Meetings:   Marland Kitchen Marital Status:   Intimate Partner Violence:   . Fear of Current or Ex-Partner:   . Emotionally Abused:   Marland Kitchen Physically Abused:   . Sexually Abused:     Allergies  Allergen Reactions  . Penicillins Swelling    DID THE REACTION INVOLVE: Swelling of the face/tongue/throat, SOB, or low BP? Yes Sudden or severe rash/hives, skin peeling, or the inside of the mouth or nose? no Did it require medical treatment? No When did it last happen?younger If all above answers are "  NO", may proceed with cephalosporin use.    Current Outpatient Medications  Medication Sig Dispense Refill  . acetaminophen (TYLENOL 8 HOUR ARTHRITIS PAIN) 650 MG CR tablet Take 1,300 mg by mouth every 8 (eight) hours as needed for pain.    Marland Kitchen ALPRAZolam (XANAX) 0.25 MG tablet Take 0.25 mg by mouth 2 (two) times daily as needed.    Marland Kitchen amLODipine (NORVASC) 2.5 MG tablet Take 2 tablets (5 mg total) by mouth daily. 30 tablet 0  . cyclobenzaprine (FLEXERIL) 5 MG tablet Take 5 mg by mouth 3 (three) times daily as needed for muscle spasms.    Marland Kitchen gabapentin (NEURONTIN) 300 MG capsule Take 300 mg by mouth 2 (two) times daily.     . hydrochlorothiazide (MICROZIDE) 12.5 MG capsule Take 12.5 mg by mouth daily.    Marland Kitchen HYDROcodone-acetaminophen (NORCO/VICODIN) 5-325 MG per tablet Take 2 tablets by mouth every 4 (four) hours as needed. 10  tablet 0  . lisinopril (ZESTRIL) 10 MG tablet Take 10 mg by mouth daily.    . metoprolol (LOPRESSOR) 50 MG tablet Take 0.5 tablets (25 mg total) by mouth daily. (Patient taking differently: Take 50 mg by mouth daily. ) 30 tablet 0  . Multiple Vitamin (MULTIVITAMIN WITH MINERALS) TABS tablet Take 1 tablet by mouth daily.    . naproxen (NAPROSYN) 500 MG tablet Take 1 tablet (500 mg total) by mouth 2 (two) times daily with a meal. 30 tablet 0  . PRESCRIPTION MEDICATION Take 1 tablet by mouth daily as needed (anxiety). Anxiety medication but did not have the bottle with her     No current facility-administered medications for this visit.    ROS:   General:  No weight loss, Fever, chills  HEENT: No recent headaches, no nasal bleeding, no visual changes, no sore throat  Neurologic: No dizziness, blackouts, seizures. No recent symptoms of stroke or mini- stroke. No recent episodes of slurred speech, or temporary blindness.  Cardiac: No recent episodes of chest pain/pressure, no shortness of breath at rest.  No shortness of breath with exertion.  Denies history of atrial fibrillation or irregular heartbeat  Vascular: No history of rest pain in feet.  No history of claudication.  No history of non-healing ulcer, No history of DVT   Pulmonary: No home oxygen, no productive cough, no hemoptysis,  No asthma or wheezing  Musculoskeletal:  [X]  Arthritis, [ ]  Low back pain,  [X]  Joint pain  Hematologic:No history of hypercoagulable state.  No history of easy bleeding.  No history of anemia  Gastrointestinal: No hematochezia or melena,  No gastroesophageal reflux, no trouble swallowing  Urinary: [ ]  chronic Kidney disease, [ ]  on HD - [ ]  MWF or [ ]  TTHS, [ ]  Burning with urination, [ ]  Frequent urination, [ ]  Difficulty urinating;   Skin: No rashes  Psychological: No history of anxiety,  No history of depression   Physical Examination  Vitals:   01/25/20 1341  BP: 137/78  Pulse: 69  Resp:  20  Temp: (!) 97.3 F (36.3 C)  SpO2: 98%  Weight: 134 lb (60.8 kg)  Height: 5\' 6"  (1.676 m)    Body mass index is 21.63 kg/m.  General:  Alert and oriented, no acute distress HEENT: Normal Neck: No JVD Cardiac: Regular Rate and Rhythm  Abdomen: Soft, non-tender, non-distended Skin: No rash Extremity Pulses:  2+ radial, brachial, femoral, popliteal absent dorsalis pedis, posterior tibial pulses bilaterally Musculoskeletal: No edema.  She has limb shortening on the left side  with a limb discrepancy with the left leg being about a half an inch shorter than the right Neurologic: Upper and lower extremity motor 5/5 and symmetric  DATA:  Patient had bilateral ABIs performed today which were greater than 1 and triphasic and normal bilaterally.  Toe pressure was 134 on the right 180 on the left also normal  ASSESSMENT: Left leg pain most likely secondary to degenerative arthritis.  She does have some findings on physical exam suggestive of peripheral arterial disease as her pedal pulses are not easily palpable.  She does have a good popliteal pulse bilaterally.  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  However she has adequate perfusion and is not at risk for limb loss.  I do not believe that her current pain symptoms are related to arterial occlusive disease.   PLAN: Patient will follow up with me on as-needed basis.   Fabienne Bruns, MD [facility] Office: [phone] Pager: [phone]

## 2020-01-30 ENCOUNTER — Encounter: Payer: Self-pay | Admitting: Cardiology

## 2020-04-24 ENCOUNTER — Emergency Department (HOSPITAL_COMMUNITY): Payer: Medicare Other

## 2020-04-24 ENCOUNTER — Encounter (HOSPITAL_COMMUNITY): Payer: Self-pay | Admitting: Emergency Medicine

## 2020-04-24 ENCOUNTER — Emergency Department (HOSPITAL_COMMUNITY)
Admission: EM | Admit: 2020-04-24 | Discharge: 2020-04-24 | Disposition: A | Discharge status: Home / Self Care | Payer: Medicare Other | Attending: Emergency Medicine | Admitting: Emergency Medicine

## 2020-04-24 ENCOUNTER — Other Ambulatory Visit: Payer: Self-pay

## 2020-04-24 DIAGNOSIS — R0789 Other chest pain: Secondary | ICD-10-CM | POA: Insufficient documentation

## 2020-04-24 DIAGNOSIS — Z5321 Procedure and treatment not carried out due to patient leaving prior to being seen by health care provider: Secondary | ICD-10-CM | POA: Insufficient documentation

## 2020-04-24 NOTE — ED Triage Notes (Signed)
Pt brought to ED by [facility] for evaluation of a left side rib cage pain after she fell from a bar high chair yesterday and hit her left side. Pt states pain is increasing since yesterday with some swollen on her left side. BP 150/78, HR 68, R 16, 99% RA.Marland Kitchen

## 2020-05-07 ENCOUNTER — Other Ambulatory Visit: Payer: Self-pay | Admitting: Internal Medicine

## 2020-05-07 DIAGNOSIS — M25552 Pain in left hip: Secondary | ICD-10-CM

## 2020-05-09 ENCOUNTER — Other Ambulatory Visit: Payer: Self-pay

## 2020-05-09 ENCOUNTER — Ambulatory Visit
Admission: RE | Admit: 2020-05-09 | Discharge: 2020-05-09 | Disposition: A | Discharge status: Home / Self Care | Payer: Medicare Other | Source: Ambulatory Visit | Attending: Internal Medicine | Admitting: Internal Medicine

## 2020-05-09 DIAGNOSIS — M25552 Pain in left hip: Secondary | ICD-10-CM

## 2020-05-09 MED ORDER — METHYLPREDNISOLONE ACETATE 40 MG/ML INJ SUSP (RADIOLOG
120.0000 mg | Freq: Once | INTRAMUSCULAR | Status: AC
  Administered 2020-05-09: 120 mg via INTRA_ARTICULAR

## 2020-05-09 MED ORDER — IOPAMIDOL (ISOVUE-M 200) INJECTION 41%
1.0000 mL | Freq: Once | INTRAMUSCULAR | Status: AC
  Administered 2020-05-09: 1 mL via INTRA_ARTICULAR

## 2021-07-05 ENCOUNTER — Other Ambulatory Visit: Payer: Self-pay

## 2021-07-05 ENCOUNTER — Ambulatory Visit (HOSPITAL_COMMUNITY)
Admission: EM | Admit: 2021-07-05 | Discharge: 2021-07-05 | Disposition: A | Discharge status: Home / Self Care | Payer: Medicare Other | Attending: Internal Medicine | Admitting: Internal Medicine

## 2021-07-05 DIAGNOSIS — M1612 Unilateral primary osteoarthritis, left hip: Secondary | ICD-10-CM | POA: Diagnosis not present

## 2021-07-05 MED ORDER — MELOXICAM 7.5 MG PO TABS: 7.5000 mg | ORAL_TABLET | Freq: Every day | ORAL | 0 refills | Status: DC

## 2021-07-05 NOTE — ED Provider Notes (Signed)
[facility]    CSN: [id number] Arrival date & time: 07/05/21  1456      History   Chief Complaint Chief Complaint  Patient presents with   Hip Pain    HPI Caroline Ortiz is a 87 y.o. female with a history of severe osteoarthritis of the left hip comes to urgent care with complaints of worsening left hip pain over the past couple of weeks.  Patient denies any falls.  Pain is severe, constant, aggravated by movement with no known relieving factors.  She has had steroid injections into the left hip joint in the past.  She is currently on a 6-week schedule for the left hip joint injections.  According to the family patient is not a candidate for hip replacement.  HPI  Past Medical History:  Diagnosis Date   CHF (congestive heart failure) (HCC)    Hep C w/ coma, chronic (HCC)    High cholesterol    Hypertension     Patient Active Problem List   Diagnosis Date Noted   Primary osteoarthritis of left hip 05/16/2019   Systolic anterior movement of mitral valve 11/14/2018   Paroxysmal SVT (supraventricular tachycardia) (HCC) 11/14/2018   Essential hypertension 11/14/2018   Palpitations 11/14/2018   Preop cardiovascular exam 11/14/2018    Past Surgical History:  Procedure Laterality Date   HYSTEROTOMY     KNEE SURGERY      OB History   No obstetric history on file.      Home Medications    Prior to Admission medications   Medication Sig Start Date End Date Taking? Authorizing Provider  meloxicam (MOBIC) 7.5 MG tablet Take 1 tablet (7.5 mg total) by mouth daily. 07/05/21  Yes [place], Britta Mccreedy, MD  acetaminophen (TYLENOL 8 HOUR ARTHRITIS PAIN) 650 MG CR tablet Take 1,300 mg by mouth every 8 (eight) hours as needed for pain.    [provider]  ALPRAZolam Prudy Feeler) 0.25 MG tablet Take 0.25 mg by mouth 2 (two) times daily as needed. 12/08/19   [provider]  amLODipine (NORVASC) 2.5 MG tablet Take 2 tablets (5 mg total) by mouth daily.  11/17/16   Arby Barrette, MD  cyclobenzaprine (FLEXERIL) 5 MG tablet Take 5 mg by mouth 3 (three) times daily as needed for muscle spasms.    [provider]  gabapentin (NEURONTIN) 300 MG capsule Take 300 mg by mouth 2 (two) times daily.     [provider]  hydrochlorothiazide (MICROZIDE) 12.5 MG capsule Take 12.5 mg by mouth daily. 12/15/19   [provider]  HYDROcodone-acetaminophen (NORCO/VICODIN) 5-325 MG per tablet Take 2 tablets by mouth every 4 (four) hours as needed. 04/29/14   Eber Hong, MD  lisinopril (ZESTRIL) 10 MG tablet Take 10 mg by mouth daily. 12/15/19   [provider]  metoprolol (LOPRESSOR) 50 MG tablet Take 0.5 tablets (25 mg total) by mouth daily. Patient taking differently: Take 50 mg by mouth daily.  11/17/16   Arby Barrette, MD  Multiple Vitamin (MULTIVITAMIN WITH MINERALS) TABS tablet Take 1 tablet by mouth daily.    [provider]  PRESCRIPTION MEDICATION Take 1 tablet by mouth daily as needed (anxiety). Anxiety medication but did not have the bottle with her    [provider]    Family History Family History  Problem Relation Age of Onset   Stomach cancer Mother    Prostate cancer Father    Breast cancer Sister    Prostate cancer Brother  Breast cancer Daughter    Stomach cancer Daughter     Social History Social History   Tobacco Use   Smoking status: Never   Smokeless tobacco: Never  Vaping Use   Vaping Use: Never used  Substance Use Topics   Alcohol use: No   Drug use: No     Allergies   Penicillins   Review of Systems Review of Systems  Musculoskeletal:  Positive for arthralgias. Negative for back pain, gait problem and myalgias.    Physical Exam Triage Vital Signs ED Triage Vitals  Enc Vitals Group     BP 07/05/21 1637 138/76     Pulse Rate 07/05/21 1637 67     Resp 07/05/21 1637 18     Temp 07/05/21 1637 97.9 F (36.6 C)     Temp src --      SpO2 07/05/21 1637 98 %      Weight --      Height --      Head Circumference --      Peak Flow --      Pain Score 07/05/21 1635 10     Pain Loc --      Pain Edu? --      Excl. in [place]? --    No data found.  Updated Vital Signs BP 138/76   Pulse 67   Temp 97.9 F (36.6 C)   Resp 18   SpO2 98%   Visual Acuity Right Eye Distance:   Left Eye Distance:   Bilateral Distance:    Right Eye Near:   Left Eye Near:    Bilateral Near:     Physical Exam Vitals and nursing note reviewed.  Constitutional:      General: She is not in acute distress.    Appearance: She is not ill-appearing.  Cardiovascular:     Rate and Rhythm: Normal rate and regular rhythm.     Pulses: Normal pulses.     Heart sounds: Normal heart sounds.  Pulmonary:     Effort: Pulmonary effort is normal.     Breath sounds: Normal breath sounds.  Musculoskeletal:        General: No tenderness, deformity or signs of injury. Normal range of motion.  Neurological:     Mental Status: She is alert.     UC Treatments / Results  Labs (all labs ordered are listed, but only abnormal results are displayed) Labs Reviewed - No data to display  EKG   Radiology No results found.  Procedures Procedures (including critical care time)  Medications Ordered in UC Medications - No data to display  Initial Impression / Assessment and Plan / UC Course  I have reviewed the triage vital signs and the nursing notes.  Pertinent labs & imaging results that were available during my care of the patient were reviewed by me and considered in my medical decision making (see chart for details).     1.  Severe osteoarthritis of the left hip: Meloxicam 7.5 mg orally daily Tylenol arthritis as needed for pain Follow-up with orthopedic surgery pain management  Final Clinical Impressions(s) / UC Diagnoses   Final diagnoses:  Osteoarthritis of left hip, unspecified osteoarthritis type     Discharge Instructions      Please take medications  as prescribed Gentle range of motion exercises If symptoms worsen please reach out to the orthopedic surgery team sooner than the scheduled time.   ED Prescriptions     Medication Sig Dispense Auth. Provider  meloxicam (MOBIC) 7.5 MG tablet Take 1 tablet (7.5 mg total) by mouth daily. 30 tablet Jayin Derousse, Britta Mccreedy, MD      PDMP not reviewed this encounter.   Merrilee Jansky, MD 07/05/21 [phone]

## 2021-07-05 NOTE — ED Triage Notes (Signed)
Pt presents for Lt hip pain

## 2021-07-05 NOTE — Discharge Instructions (Addendum)
Please take medications as prescribed Gentle range of motion exercises If symptoms worsen please reach out to the orthopedic surgery team sooner than the scheduled time.

## 2021-07-05 NOTE — ED Triage Notes (Signed)
PT family wants an x-ray of lt hip

## 2021-07-06 ENCOUNTER — Telehealth (HOSPITAL_COMMUNITY): Payer: Self-pay | Admitting: Internal Medicine

## 2021-07-06 MED ORDER — MELOXICAM 7.5 MG PO TABS
7.5000 mg | ORAL_TABLET | Freq: Every day | ORAL | 0 refills | Status: DC
Start: 2021-07-06 — End: 2021-10-03

## 2021-07-06 NOTE — Telephone Encounter (Signed)
MEdication did not go to pharmacy, will be re sent to [facility]

## 2021-08-25 ENCOUNTER — Other Ambulatory Visit: Payer: Self-pay | Admitting: Internal Medicine

## 2021-08-25 DIAGNOSIS — R1319 Other dysphagia: Secondary | ICD-10-CM

## 2021-08-29 ENCOUNTER — Ambulatory Visit
Admission: RE | Admit: 2021-08-29 | Discharge: 2021-08-29 | Disposition: A | Discharge status: Home / Self Care | Payer: Medicare Other | Source: Ambulatory Visit | Attending: Internal Medicine | Admitting: Internal Medicine

## 2021-08-29 ENCOUNTER — Other Ambulatory Visit: Payer: Self-pay | Admitting: Internal Medicine

## 2021-08-29 DIAGNOSIS — R1319 Other dysphagia: Secondary | ICD-10-CM

## 2021-10-03 ENCOUNTER — Other Ambulatory Visit: Payer: Self-pay

## 2021-10-03 ENCOUNTER — Encounter (HOSPITAL_COMMUNITY): Payer: Self-pay

## 2021-10-03 ENCOUNTER — Ambulatory Visit (HOSPITAL_COMMUNITY)
Admission: EM | Admit: 2021-10-03 | Discharge: 2021-10-03 | Disposition: A | Discharge status: Home / Self Care | Payer: Medicare Other | Attending: Urgent Care | Admitting: Urgent Care

## 2021-10-03 DIAGNOSIS — R220 Localized swelling, mass and lump, head: Secondary | ICD-10-CM

## 2021-10-03 DIAGNOSIS — K0889 Other specified disorders of teeth and supporting structures: Secondary | ICD-10-CM

## 2021-10-03 DIAGNOSIS — K047 Periapical abscess without sinus: Secondary | ICD-10-CM | POA: Diagnosis not present

## 2021-10-03 MED ORDER — MELOXICAM 7.5 MG PO TABS: 7.5000 mg | ORAL_TABLET | Freq: Every day | ORAL | 0 refills | Status: DC

## 2021-10-03 MED ORDER — CLINDAMYCIN HCL 300 MG PO CAPS: 300.0000 mg | ORAL_CAPSULE | Freq: Three times a day (TID) | ORAL | 0 refills | Status: DC

## 2021-10-03 NOTE — ED Triage Notes (Signed)
Pt presents with c/o swelling on the L side of face.   Pt concerned of dental infection.

## 2021-10-03 NOTE — ED Provider Notes (Signed)
[facility]   MRN: [id number] DOB: 05-May-1932  Subjective:   Caroline Ortiz is a 88 y.o. female presenting for 1 day history of acute onset severe left-sided lower facial pain, swelling, dental pain.  She did have dental pain initially earlier in the week and then made an appointment to see a dentist next week.  Unfortunately, her symptoms progressed rapidly to what she has today.  She does have a history of liver disease and is not able to take Tylenol.  No current facility-administered medications for this encounter.  Current Outpatient Medications:    acetaminophen (TYLENOL 8 HOUR ARTHRITIS PAIN) 650 MG CR tablet, Take 1,300 mg by mouth every 8 (eight) hours as needed for pain., Disp: , Rfl:    ALPRAZolam (XANAX) 0.25 MG tablet, Take 0.25 mg by mouth 2 (two) times daily as needed., Disp: , Rfl:    amLODipine (NORVASC) 2.5 MG tablet, Take 2 tablets (5 mg total) by mouth daily., Disp: 30 tablet, Rfl: 0   cyclobenzaprine (FLEXERIL) 5 MG tablet, Take 5 mg by mouth 3 (three) times daily as needed for muscle spasms., Disp: , Rfl:    gabapentin (NEURONTIN) 300 MG capsule, Take 300 mg by mouth 2 (two) times daily. , Disp: , Rfl:    hydrochlorothiazide (MICROZIDE) 12.5 MG capsule, Take 12.5 mg by mouth daily., Disp: , Rfl:    HYDROcodone-acetaminophen (NORCO/VICODIN) 5-325 MG per tablet, Take 2 tablets by mouth every 4 (four) hours as needed., Disp: 10 tablet, Rfl: 0   lisinopril (ZESTRIL) 10 MG tablet, Take 10 mg by mouth daily., Disp: , Rfl:    meloxicam (MOBIC) 7.5 MG tablet, Take 1 tablet (7.5 mg total) by mouth daily., Disp: 30 tablet, Rfl: 0   metoprolol (LOPRESSOR) 50 MG tablet, Take 0.5 tablets (25 mg total) by mouth daily. (Patient taking differently: Take 50 mg by mouth daily. ), Disp: 30 tablet, Rfl: 0   Multiple Vitamin (MULTIVITAMIN WITH MINERALS) TABS tablet, Take 1 tablet by mouth daily., Disp: , Rfl:    PRESCRIPTION MEDICATION, Take 1 tablet by mouth daily as  needed (anxiety). Anxiety medication but did not have the bottle with her, Disp: , Rfl:    Allergies  Allergen Reactions   Penicillins Swelling    DID THE REACTION INVOLVE: Swelling of the face/tongue/throat, SOB, or low BP? Yes Sudden or severe rash/hives, skin peeling, or the inside of the mouth or nose? no Did it require medical treatment? No When did it last happen?    younger   If all above answers are "NO", may proceed with cephalosporin use.    Past Medical History:  Diagnosis Date   CHF (congestive heart failure) (HCC)    Hep C w/ coma, chronic    High cholesterol    Hypertension      Past Surgical History:  Procedure Laterality Date   HYSTEROTOMY     KNEE SURGERY      Family History  Problem Relation Age of Onset   Stomach cancer Mother    Prostate cancer Father    Breast cancer Sister    Prostate cancer Brother    Breast cancer Daughter    Stomach cancer Daughter     Social History   Tobacco Use   Smoking status: Never   Smokeless tobacco: Never  Vaping Use   Vaping Use: Never used  Substance Use Topics   Alcohol use: No   Drug use: No    ROS   Objective:  Vitals: BP (!) 164/88 (BP Location: Right Arm)    Pulse 85    Temp 99.7 F (37.6 C) (Oral)    Resp 17    SpO2 97%   Physical Exam Constitutional:      General: She is not in acute distress.    Appearance: Normal appearance. She is well-developed. She is not ill-appearing, toxic-appearing or diaphoretic.  HENT:     Head: Normocephalic and atraumatic.      Right Ear: External ear normal.     Left Ear: External ear normal.     Nose: Nose normal.     Mouth/Throat:     Mouth: Mucous membranes are moist.     Pharynx: No oropharyngeal exudate or posterior oropharyngeal erythema.   Eyes:     General: No scleral icterus.       Right eye: No discharge.        Left eye: No discharge.     Extraocular Movements: Extraocular movements intact.     Conjunctiva/sclera: Conjunctivae normal.   Cardiovascular:     Rate and Rhythm: Normal rate.  Pulmonary:     Effort: Pulmonary effort is normal.  Skin:    General: Skin is warm and dry.  Neurological:     General: No focal deficit present.     Mental Status: She is alert and oriented to person, place, and time.  Psychiatric:        Mood and Affect: Mood normal.        Behavior: Behavior normal.        Thought Content: Thought content normal.        Judgment: Judgment normal.    Assessment and Plan :   PDMP not reviewed this encounter.  1. Dental infection   2. Facial swelling   3. Pain, dental    Start clindamycin given her penicillin allergy for dental infection/abscess, use meloxicam for pain and inflammation as she has used this before and has a history of liver disease. Emphasized need to maintain her appointment next week with dental surgeon. Counseled patient on potential for adverse effects with medications prescribed/recommended today, strict ER and return-to-clinic precautions discussed, patient verbalized understanding.    Wallis Bamberg, PA-C 10/03/21 1130

## 2021-10-03 NOTE — ED Notes (Signed)
Pt reports dental appointment has been made for follow up.

## 2021-12-12 ENCOUNTER — Other Ambulatory Visit (HOSPITAL_COMMUNITY): Payer: Self-pay | Admitting: Internal Medicine

## 2021-12-12 DIAGNOSIS — I739 Peripheral vascular disease, unspecified: Secondary | ICD-10-CM

## 2021-12-15 ENCOUNTER — Other Ambulatory Visit: Payer: Self-pay

## 2021-12-15 ENCOUNTER — Ambulatory Visit (HOSPITAL_COMMUNITY)
Admission: RE | Admit: 2021-12-15 | Discharge: 2021-12-15 | Disposition: A | Discharge status: Home / Self Care | Payer: Medicare Other | Source: Ambulatory Visit | Attending: Internal Medicine | Admitting: Internal Medicine

## 2021-12-15 DIAGNOSIS — I739 Peripheral vascular disease, unspecified: Secondary | ICD-10-CM | POA: Diagnosis not present

## 2022-06-14 ENCOUNTER — Encounter (HOSPITAL_COMMUNITY): Payer: Self-pay | Admitting: *Deleted

## 2022-06-14 ENCOUNTER — Other Ambulatory Visit: Payer: Self-pay

## 2022-06-14 ENCOUNTER — Ambulatory Visit (HOSPITAL_COMMUNITY)
Admission: EM | Admit: 2022-06-14 | Discharge: 2022-06-14 | Disposition: A | Discharge status: Home / Self Care | Payer: Medicare Other | Attending: Physician Assistant | Admitting: Physician Assistant

## 2022-06-14 ENCOUNTER — Ambulatory Visit (INDEPENDENT_AMBULATORY_CARE_PROVIDER_SITE_OTHER): Payer: Medicare Other

## 2022-06-14 DIAGNOSIS — R059 Cough, unspecified: Secondary | ICD-10-CM

## 2022-06-14 DIAGNOSIS — R051 Acute cough: Secondary | ICD-10-CM

## 2022-06-14 DIAGNOSIS — J329 Chronic sinusitis, unspecified: Secondary | ICD-10-CM

## 2022-06-14 DIAGNOSIS — J40 Bronchitis, not specified as acute or chronic: Secondary | ICD-10-CM | POA: Diagnosis not present

## 2022-06-14 MED ORDER — DOXYCYCLINE HYCLATE 100 MG PO CAPS: 100.0000 mg | ORAL_CAPSULE | Freq: Two times a day (BID) | ORAL | 0 refills | Status: DC

## 2022-06-14 MED ORDER — BENZONATATE 100 MG PO CAPS: 100.0000 mg | ORAL_CAPSULE | Freq: Three times a day (TID) | ORAL | 0 refills | Status: DC

## 2022-06-14 NOTE — ED Provider Notes (Signed)
[facility]    CSN: [id number] Arrival date & time: 06/14/22  1018      History   Chief Complaint Chief Complaint  Patient presents with   Cough   Muscle Pain    HPI Caroline Ortiz is a 88 y.o. female.   Patient presents today with a weeklong history of URI symptoms including cough and sinus congestion.  Denies any fever, chest pain, shortness of breath, nausea, vomiting.  Denies any known sick contacts.  Has had COVID with last episode in July 2023.  Denies any recent antibiotic or steroid use.  Does have a history of heart failure but reports that she is doing well with current medications.  Denies history of diabetes.  She does not smoke.  Denies history of asthma, allergies, COPD.  She has tried over-the-counter Robitussin without improvement of symptoms.  She is having difficulty with daily activities as result of symptoms.    Past Medical History:  Diagnosis Date   CHF (congestive heart failure) (HCC)    Hep C w/ coma, chronic    High cholesterol    Hypertension     Patient Active Problem List   Diagnosis Date Noted   Primary osteoarthritis of left hip 05/16/2019   Systolic anterior movement of mitral valve 11/14/2018   Paroxysmal SVT (supraventricular tachycardia) (HCC) 11/14/2018   Essential hypertension 11/14/2018   Palpitations 11/14/2018   Preop cardiovascular exam 11/14/2018    Past Surgical History:  Procedure Laterality Date   HYSTEROTOMY     KNEE SURGERY      OB History   No obstetric history on file.      Home Medications    Prior to Admission medications   Medication Sig Start Date End Date Taking? Authorizing Provider  benzonatate (TESSALON) 100 MG capsule Take 1 capsule (100 mg total) by mouth every 8 (eight) hours. 06/14/22  Yes Avalyn Molino K, PA-C  doxycycline (VIBRAMYCIN) 100 MG capsule Take 1 capsule (100 mg total) by mouth 2 (two) times daily. 06/14/22  Yes Calin Ellery, Noberto Retort, PA-C  acetaminophen (TYLENOL 8 HOUR ARTHRITIS  PAIN) 650 MG CR tablet Take 1,300 mg by mouth every 8 (eight) hours as needed for pain.    [provider]  ALPRAZolam Prudy Feeler) 0.25 MG tablet Take 0.25 mg by mouth 2 (two) times daily as needed. 12/08/19   [provider]  amLODipine (NORVASC) 2.5 MG tablet Take 2 tablets (5 mg total) by mouth daily. 11/17/16   Arby Barrette, MD  cyclobenzaprine (FLEXERIL) 5 MG tablet Take 5 mg by mouth 3 (three) times daily as needed for muscle spasms.    [provider]  gabapentin (NEURONTIN) 300 MG capsule Take 300 mg by mouth 2 (two) times daily.     [provider]  hydrochlorothiazide (MICROZIDE) 12.5 MG capsule Take 12.5 mg by mouth daily. 12/15/19   [provider]  HYDROcodone-acetaminophen (NORCO/VICODIN) 5-325 MG per tablet Take 2 tablets by mouth every 4 (four) hours as needed. 04/29/14   Eber Hong, MD  lisinopril (ZESTRIL) 10 MG tablet Take 10 mg by mouth daily. 12/15/19   [provider]  meloxicam (MOBIC) 7.5 MG tablet Take 1 tablet (7.5 mg total) by mouth daily. 10/03/21   Wallis Bamberg, PA-C  metoprolol (LOPRESSOR) 50 MG tablet Take 0.5 tablets (25 mg total) by mouth daily. Patient taking differently: Take 50 mg by mouth daily.  11/17/16   Arby Barrette, MD  Multiple Vitamin (MULTIVITAMIN WITH MINERALS) TABS tablet Take 1 tablet by  mouth daily.    [provider]  PRESCRIPTION MEDICATION Take 1 tablet by mouth daily as needed (anxiety). Anxiety medication but did not have the bottle with her    [provider]    Family History Family History  Problem Relation Age of Onset   Stomach cancer Mother    Prostate cancer Father    Breast cancer Sister    Prostate cancer Brother    Breast cancer Daughter    Stomach cancer Daughter     Social History Social History   Tobacco Use   Smoking status: Never   Smokeless tobacco: Never  Vaping Use   Vaping Use: Never used  Substance Use Topics   Alcohol use: No   Drug use: No      Allergies   Penicillins   Review of Systems Review of Systems  Constitutional:  Positive for activity change. Negative for appetite change, fatigue and fever.  HENT:  Positive for congestion, postnasal drip and sinus pressure. Negative for sneezing and sore throat.   Respiratory:  Positive for cough. Negative for shortness of breath.   Cardiovascular:  Negative for chest pain.  Gastrointestinal:  Negative for abdominal pain, diarrhea, nausea and vomiting.  Neurological:  Negative for dizziness, light-headedness and headaches.     Physical Exam Triage Vital Signs ED Triage Vitals  Enc Vitals Group     BP 06/14/22 1122 113/68     Pulse Rate 06/14/22 1122 63     Resp 06/14/22 1122 20     Temp 06/14/22 1122 98.3 F (36.8 C)     Temp src --      SpO2 06/14/22 1122 99 %     Weight --      Height --      Head Circumference --      Peak Flow --      Pain Score 06/14/22 1120 2     Pain Loc --      Pain Edu? --      Excl. in [place]? --    No data found.  Updated Vital Signs BP 113/68   Pulse 63   Temp 98.3 F (36.8 C)   Resp 20   SpO2 99%   Visual Acuity Right Eye Distance:   Left Eye Distance:   Bilateral Distance:    Right Eye Near:   Left Eye Near:    Bilateral Near:     Physical Exam Vitals reviewed.  Constitutional:      General: She is awake. She is not in acute distress.    Appearance: Normal appearance. She is well-developed. She is not ill-appearing.     Comments: Very pleasant female appears stated age in no acute distress sitting comfortably in exam room  HENT:     Head: Normocephalic and atraumatic.     Right Ear: Tympanic membrane, ear canal and external ear normal. Tympanic membrane is not erythematous or bulging.     Left Ear: Tympanic membrane, ear canal and external ear normal. Tympanic membrane is not erythematous or bulging.     Nose:     Right Sinus: No maxillary sinus tenderness or frontal sinus tenderness.     Left Sinus: No maxillary  sinus tenderness or frontal sinus tenderness.     Mouth/Throat:     Pharynx: Uvula midline. Posterior oropharyngeal erythema present. No oropharyngeal exudate.     Comments: Drainage present posterior oropharynx Cardiovascular:     Rate and Rhythm: Normal rate and regular rhythm.  Heart sounds: Normal heart sounds, S1 normal and S2 normal. No murmur heard. Pulmonary:     Effort: Pulmonary effort is normal.     Breath sounds: Examination of the right-lower field reveals decreased breath sounds. Examination of the left-lower field reveals decreased breath sounds. Decreased breath sounds present. No wheezing, rhonchi or rales.     Comments: Decreased aeration bilateral bases with no adventitious sounds Psychiatric:        Behavior: Behavior is cooperative.      UC Treatments / Results  Labs (all labs ordered are listed, but only abnormal results are displayed) Labs Reviewed - No data to display  EKG   Radiology DG Chest 2 View  Result Date: 06/14/2022 CLINICAL DATA:  88 year old female with history of productive cough for 1 week. EXAM: CHEST - 2 VIEW COMPARISON:  Chest x-ray 04/24/2020. FINDINGS: Lung volumes are normal. No consolidative airspace disease. No pleural effusions. No pneumothorax. No pulmonary nodule or mass noted. Severe calcifications of the mitral annulus. Pulmonary vasculature and the cardiomediastinal silhouette are within normal limits. Atherosclerosis in the thoracic aorta. IMPRESSION: 1.  No radiographic evidence of acute cardiopulmonary disease. 2. Aortic atherosclerosis. 3. Severe calcifications of the mitral annulus. Electronically Signed   By: Trudie Reed M.D.   On: 06/14/2022 12:35    Procedures Procedures (including critical care time)  Medications Ordered in UC Medications - No data to display  Initial Impression / Assessment and Plan / UC Course  I have reviewed the triage vital signs and the nursing notes.  Pertinent labs & imaging results  that were available during my care of the patient were reviewed by me and considered in my medical decision making (see chart for details).     Patient is well-appearing, afebrile, nontoxic, nontachycardic.  No indication for viral testing as patient has been symptomatic for over a week and this would not change management.  Given prolonged and worsening symptoms will cover for secondary bacterial infection.  Patient is allergic to penicillin so we will use doxycycline 100 mcg twice daily for 10 days.  Discussed that this can make her sensitive to the sun and she should stay out of the sun while on this.  Recommended over-the-counter medications including Tylenol, Mucinex, Flonase.  She is to rest and drink plenty of fluid.  She was given Tessalon for cough.  Recommended she rest and drink plenty of fluid.  She is to follow-up with her primary care next week.  Discussed that if she has any worsening symptoms including worsening cough, shortness of breath, fever, chest pain, nausea/vomiting, weakness she needs to go to the emergency room.  Strict return precautions given.  Final Clinical Impressions(s) / UC Diagnoses   Final diagnoses:  Sinobronchitis  Acute cough     Discharge Instructions      Your x-ray did not show any evidence of pneumonia.  Please start doxycycline 100 mg twice daily.  Stay out of the sun while on this medication as it can give you a bad sunburn.  Use over-the-counter medication for symptom management such as Tylenol, Mucinex, Flonase.  Make sure you rest and drink plenty fluid.  Follow-up with your primary care next week.  If you have any worsening symptoms including cough, shortness of breath, fever, chest pain, nausea/vomiting, weakness you need to go to the emergency room.     ED Prescriptions     Medication Sig Dispense Auth. Provider   doxycycline (VIBRAMYCIN) 100 MG capsule Take 1 capsule (100 mg total) by  mouth 2 (two) times daily. 20 capsule Jovani Colquhoun K,  PA-C   benzonatate (TESSALON) 100 MG capsule Take 1 capsule (100 mg total) by mouth every 8 (eight) hours. 21 capsule Gomer France K, PA-C      PDMP not reviewed this encounter.   Jeani Hawking, PA-C 06/14/22 1249

## 2022-06-14 NOTE — Discharge Instructions (Signed)
Your x-ray did not show any evidence of pneumonia.  Please start doxycycline 100 mg twice daily.  Stay out of the sun while on this medication as it can give you a bad sunburn.  Use over-the-counter medication for symptom management such as Tylenol, Mucinex, Flonase.  Make sure you rest and drink plenty fluid.  Follow-up with your primary care next week.  If you have any worsening symptoms including cough, shortness of breath, fever, chest pain, nausea/vomiting, weakness you need to go to the emergency room.

## 2022-06-14 NOTE — ED Triage Notes (Signed)
Pt has had a productive cough for one week . Pt also reports muscle pain with cough. Pt has not checked for a fever.

## 2022-06-30 ENCOUNTER — Other Ambulatory Visit: Payer: Self-pay

## 2022-06-30 ENCOUNTER — Emergency Department (HOSPITAL_COMMUNITY): Payer: Medicare Other

## 2022-06-30 ENCOUNTER — Emergency Department (HOSPITAL_COMMUNITY)
Admission: EM | Admit: 2022-06-30 | Discharge: 2022-06-30 | Discharge status: Against Medical Advice | Payer: Medicare Other | Attending: Emergency Medicine | Admitting: Emergency Medicine

## 2022-06-30 DIAGNOSIS — W010XXA Fall on same level from slipping, tripping and stumbling without subsequent striking against object, initial encounter: Secondary | ICD-10-CM | POA: Diagnosis not present

## 2022-06-30 DIAGNOSIS — M25552 Pain in left hip: Secondary | ICD-10-CM | POA: Insufficient documentation

## 2022-06-30 DIAGNOSIS — Z5321 Procedure and treatment not carried out due to patient leaving prior to being seen by health care provider: Secondary | ICD-10-CM | POA: Diagnosis not present

## 2022-06-30 NOTE — ED Triage Notes (Signed)
EMS stated, she fell last night and hurt her left hip pain.

## 2022-06-30 NOTE — ED Notes (Signed)
No answer for ready room 

## 2022-06-30 NOTE — ED Provider Triage Note (Cosign Needed)
Emergency Medicine Provider Triage Evaluation Note  Caroline Ortiz , a 88 y.o. female  was evaluated in triage.  Pt complains of left hip pain, hx fx 7 years ago (couldn't have surgery due to hep c hx). Tripped and fell yesterday injuring left hip. Did not hit head, no LOC. No other injuries.   Review of Systems  Positive: As above Negative: As above  Physical Exam  BP (!) 157/74   Pulse 63   Temp 98.3 F (36.8 C) (Oral)   Resp 14   Ht 5\' 6"  (1.676 m)   SpO2 99%   BMI 21.63 kg/m  Gen:   Awake, no distress   Resp:  Normal effort  MSK:   Pain with moving left hip  Other:    Medical Decision Making  Medically screening exam initiated at 11:08 AM.  Appropriate orders placed.  JANNIFER FISCHLER was informed that the remainder of the evaluation will be completed by another provider, this initial triage assessment does not replace that evaluation, and the importance of remaining in the ED until their evaluation is complete.     Tacy Learn, PA-C 06/30/22 1109

## 2022-09-04 ENCOUNTER — Other Ambulatory Visit: Payer: Self-pay | Admitting: Physician Assistant

## 2022-09-04 ENCOUNTER — Telehealth: Payer: Self-pay | Admitting: Orthopaedic Surgery

## 2022-09-04 MED ORDER — TRAMADOL HCL 50 MG PO TABS: 50.0000 mg | ORAL_TABLET | Freq: Two times a day (BID) | ORAL | 0 refills | Status: DC | PRN

## 2022-09-04 NOTE — Telephone Encounter (Signed)
Patient is requesting a refill on her Tramadol. Please advise

## 2022-09-04 NOTE — Telephone Encounter (Signed)
Notified patient.

## 2022-09-04 NOTE — Telephone Encounter (Signed)
sent 

## 2022-10-05 IMAGING — RF DG ESOPHAGUS
10 of 11 series · 14 of 24 positions shown · non-contrast
Comparison: None.

CLINICAL DATA: Esophageal dysphagia.

EXAM:
ESOPHOGRAM/BARIUM SWALLOW
TECHNIQUE: Single contrast examination was performed using  thin barium.
FLUOROSCOPY TIME:  Fluoroscopy Time:  2 minutes and 48 seconds.
Radiation Exposure Index (if provided by the fluoroscopic device):
13.3 mGy
Number of Acquired Spot Images:

[Series 1: sequence · 0.28mm/px · 2 of 14 frames shown (1 of 9)]
[frame 3/14]
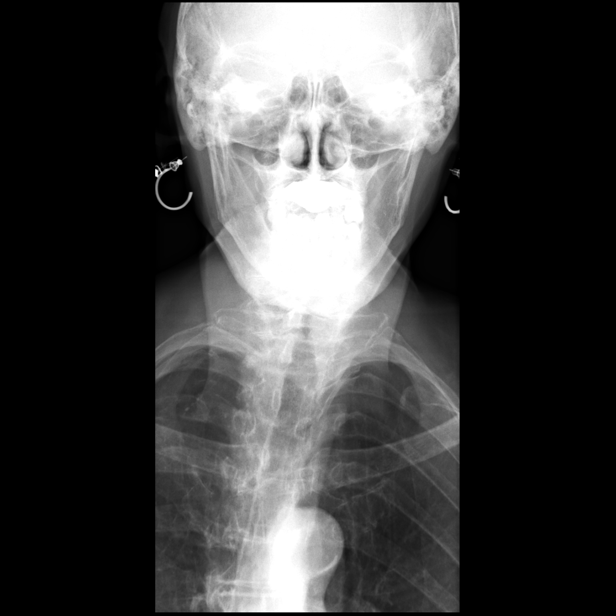
[frame 12/14]
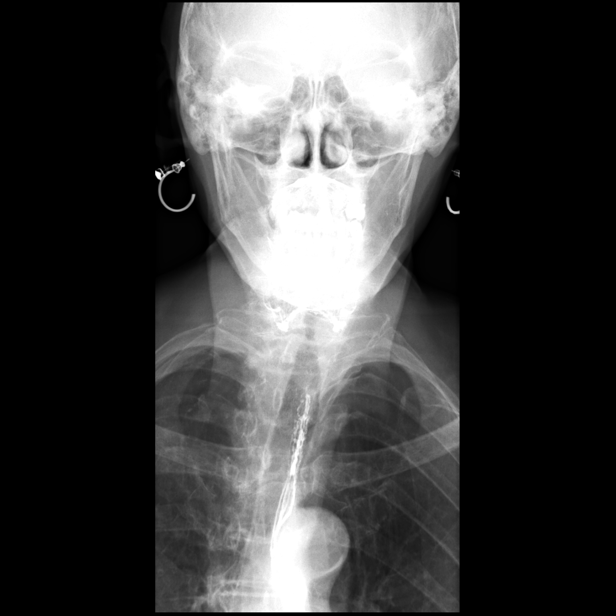

[Series 2: sequence · 0.28mm/px · 1 of 12 frames shown (2 of 9)]
[frame 11/12]
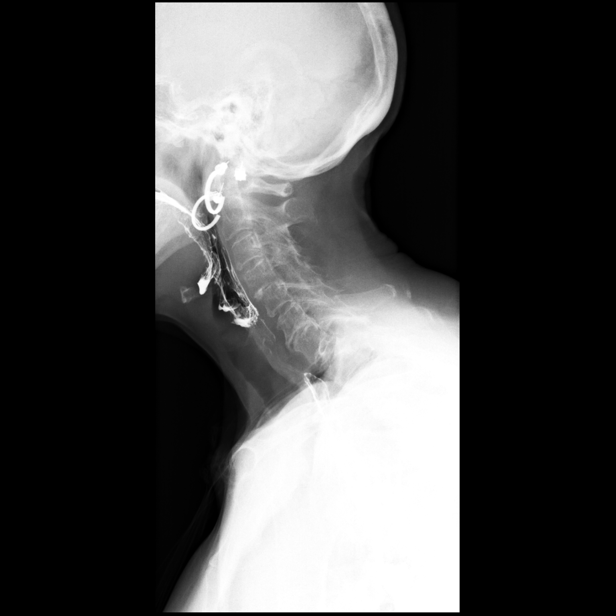

[Series 3: sequence · 1 of 26 frames shown (3 of 9)]
[frame 15/26]
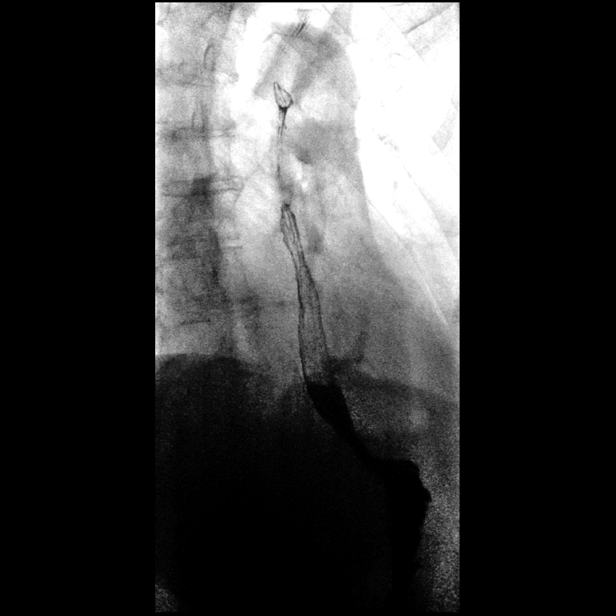

[Series 4: sequence · 2 of 65 frames shown (4 of 9)]
[frame 10/65]
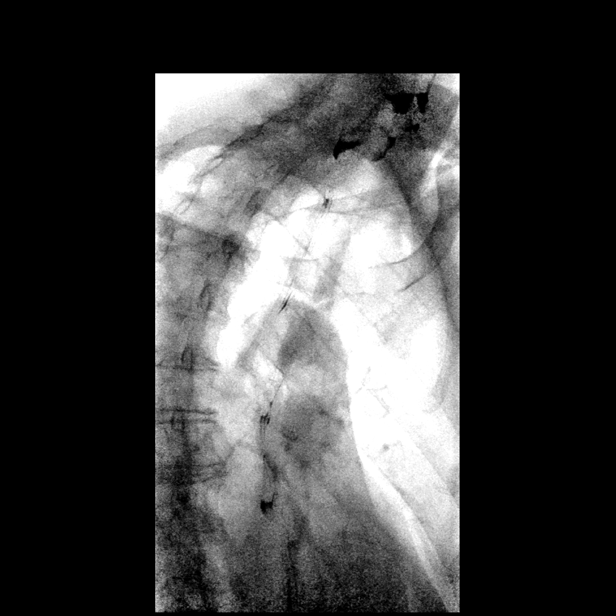
[frame 58/65]
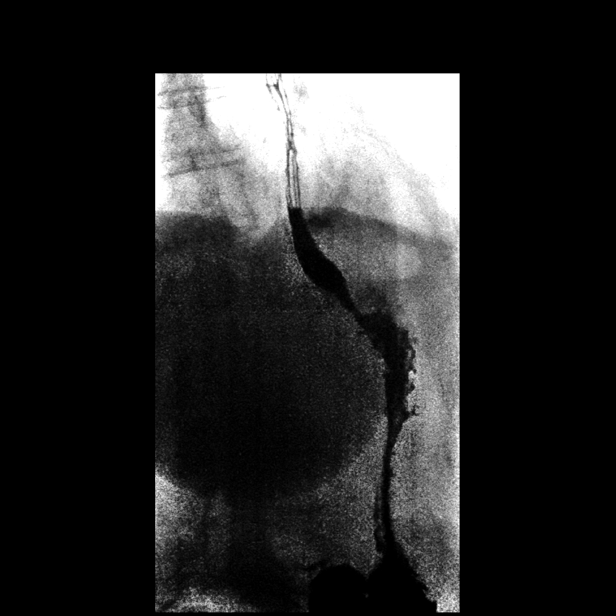

[Series 6: one shot · 1 of 1 slices shown]
[im 1/1]
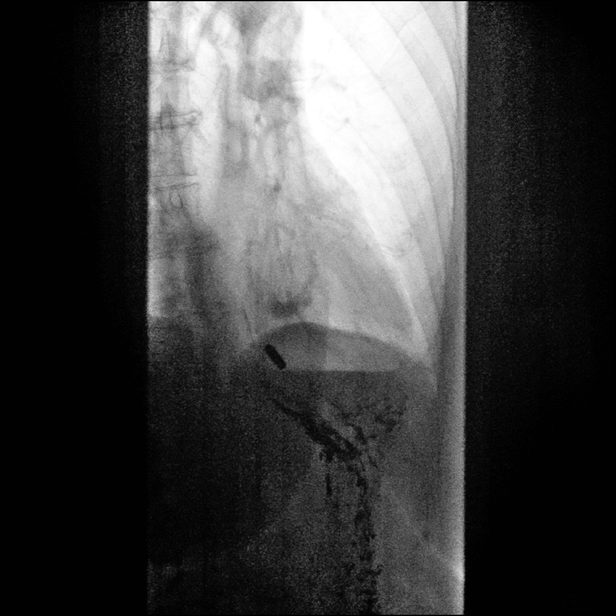

[Series 7: sequence · 1 of 71 frames shown (5 of 9)]
[frame 11/71]
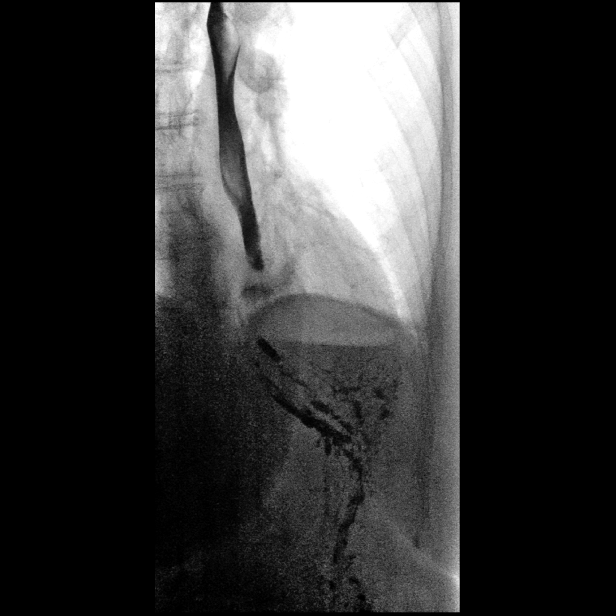

[Series 8: sequence · 2 of 11 frames shown (6 of 9)]
[frame 1/11]
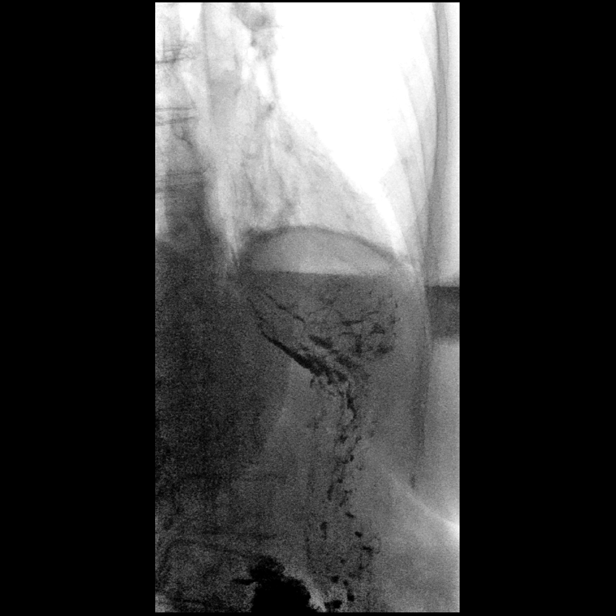
[frame 10/11]
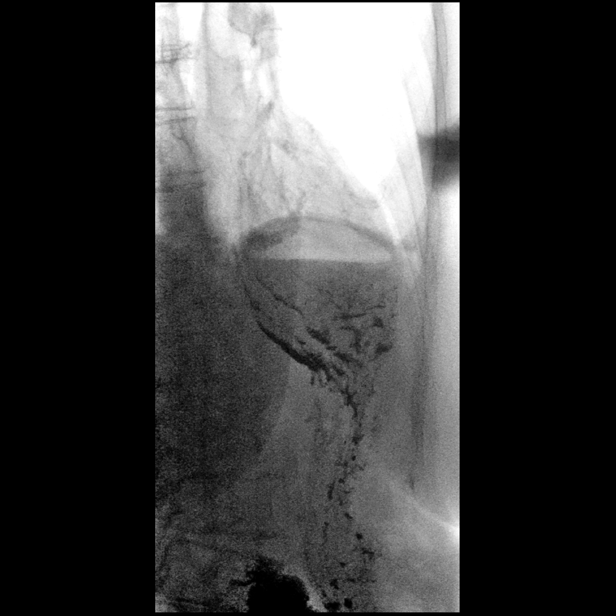

[Series 9: sequence · 1 of 46 frames shown (7 of 9)]
[frame 40/46]
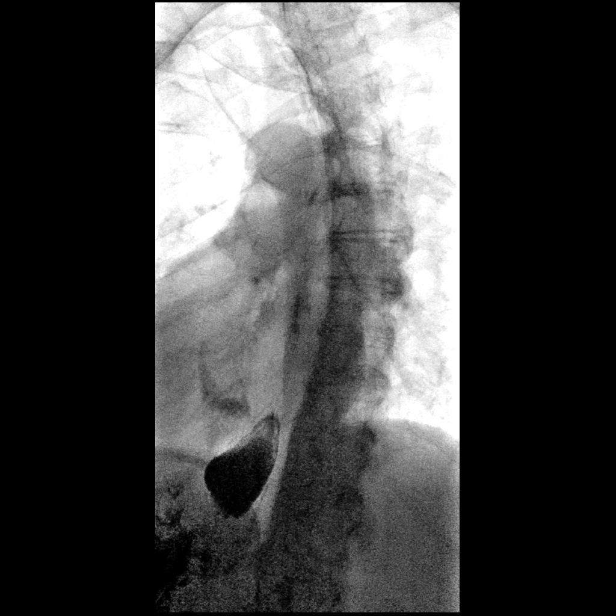

[Series 10: sequence · 1 of 64 frames shown (8 of 9)]
[frame 10/64]
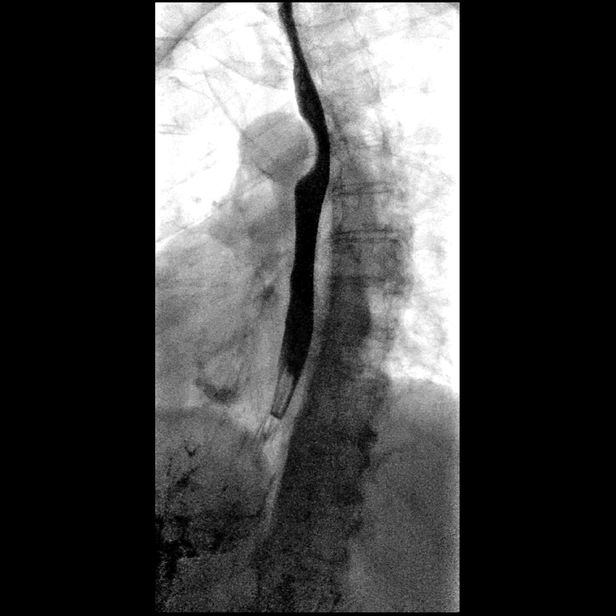

[Series 11: sequence · 2 of 61 frames shown (9 of 9)]
[frame 10/61]
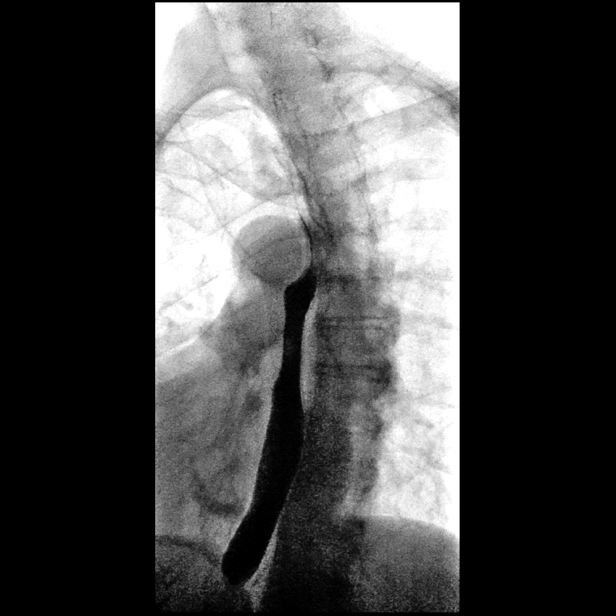
[frame 52/61]
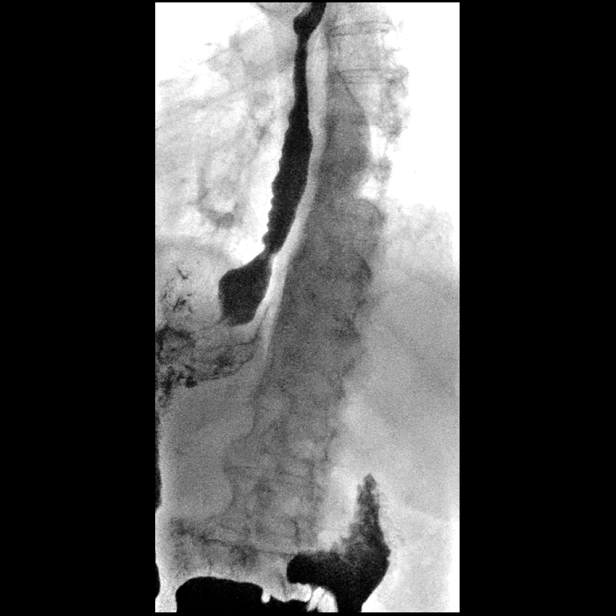

[14 of 24 positions shown; findings below may reference images not displayed]

FINDINGS: Frontal and lateral views of the hypopharynx while swallowing thick
barium are unremarkable. No aspiration.

Single contrast imaging of the esophagus shows no evidence for
esophageal stricture, mass lesion, diverticulum or gross mucosal
ulceration. Very tiny hiatal hernia evident with subtle Schatzki
ring, nonobstructive to passage of a 13 mm barium tablet. Normal
esophageal motility on [DATE] swallows with disruption of primary
peristaltic stripping wave and some mild tertiary contractions noted
on 1 of the 5 swallows.
IMPRESSION: 1. No evidence for esophageal mass lesion or stricture obstructive
to a 13 mm barium tablet.
2. Tiny hiatal hernia with subtle Schatzki ring.
3. Mild tertiary contractions in the distal esophagus noted on 1 of
5 swallows during assessment of esophageal motility.

## 2022-11-02 ENCOUNTER — Other Ambulatory Visit: Payer: Self-pay | Admitting: Physician Assistant

## 2022-11-03 ENCOUNTER — Other Ambulatory Visit: Payer: Self-pay | Admitting: Physician Assistant

## 2022-11-06 ENCOUNTER — Telehealth: Payer: Self-pay | Admitting: Orthopaedic Surgery

## 2022-11-06 NOTE — Telephone Encounter (Signed)
She has not seen [place] in over 3.5 years.  Looks like she has recently seen swinteck and another ortho doc.  Must not have realized this when I sent in last rx.  She will need to be seen by [place] or reach out to her other ortho docs

## 2022-11-06 NOTE — Telephone Encounter (Signed)
Patient requesting refill on tramadol is completely out Pharmacy sent in request but I advised patient it would be better to call in and let us know personally

## 2022-11-09 NOTE — Telephone Encounter (Signed)
Called and spoke with patient's daughter Selena Batten. They will contact Dr.Swinteck for refill.

## 2023-08-20 ENCOUNTER — Emergency Department (HOSPITAL_COMMUNITY): Payer: Medicare Other

## 2023-08-20 ENCOUNTER — Encounter (HOSPITAL_COMMUNITY): Payer: Self-pay

## 2023-08-20 ENCOUNTER — Emergency Department (HOSPITAL_COMMUNITY)
Admission: EM | Admit: 2023-08-20 | Discharge: 2023-08-20 | Disposition: A | Discharge status: Home / Self Care | Payer: Medicare Other | Attending: Emergency Medicine | Admitting: Emergency Medicine

## 2023-08-20 ENCOUNTER — Other Ambulatory Visit: Payer: Self-pay

## 2023-08-20 DIAGNOSIS — M25552 Pain in left hip: Secondary | ICD-10-CM | POA: Diagnosis present

## 2023-08-20 DIAGNOSIS — R1030 Lower abdominal pain, unspecified: Secondary | ICD-10-CM | POA: Insufficient documentation

## 2023-08-20 DIAGNOSIS — Y92019 Unspecified place in single-family (private) house as the place of occurrence of the external cause: Secondary | ICD-10-CM | POA: Insufficient documentation

## 2023-08-20 DIAGNOSIS — E871 Hypo-osmolality and hyponatremia: Secondary | ICD-10-CM | POA: Insufficient documentation

## 2023-08-20 DIAGNOSIS — W19XXXA Unspecified fall, initial encounter: Secondary | ICD-10-CM | POA: Insufficient documentation

## 2023-08-20 DIAGNOSIS — R339 Retention of urine, unspecified: Secondary | ICD-10-CM | POA: Insufficient documentation

## 2023-08-20 LAB — URINALYSIS, W/ REFLEX TO CULTURE (INFECTION SUSPECTED)
Bacteria, UA: NONE SEEN
Bilirubin Urine: NEGATIVE
Glucose, UA: NEGATIVE mg/dL
Hgb urine dipstick: NEGATIVE
Ketones, ur: NEGATIVE mg/dL
Leukocytes,Ua: NEGATIVE
Nitrite: NEGATIVE
Protein, ur: 30 mg/dL — AB
Specific Gravity, Urine: 1.008 (ref 1.005–1.030)
pH: 7 (ref 5.0–8.0)

## 2023-08-20 LAB — CBC WITH DIFFERENTIAL/PLATELET
Abs Immature Granulocytes: 0.03 10*3/uL (ref 0.00–0.07)
Basophils Absolute: 0 10*3/uL (ref 0.0–0.1)
Basophils Relative: 0 %
Eosinophils Absolute: 0 10*3/uL (ref 0.0–0.5)
Eosinophils Relative: 0 %
HCT: 33.5 % — ABNORMAL LOW (ref 36.0–46.0)
Hemoglobin: 11.3 g/dL — ABNORMAL LOW (ref 12.0–15.0)
Immature Granulocytes: 1 %
Lymphocytes Relative: 15 %
Lymphs Abs: 0.7 10*3/uL (ref 0.7–4.0)
MCH: 31.9 pg (ref 26.0–34.0)
MCHC: 33.7 g/dL (ref 30.0–36.0)
MCV: 94.6 fL (ref 80.0–100.0)
Monocytes Absolute: 0.5 10*3/uL (ref 0.1–1.0)
Monocytes Relative: 10 %
Neutro Abs: 3.3 10*3/uL (ref 1.7–7.7)
Neutrophils Relative %: 74 %
Platelets: 167 10*3/uL (ref 150–400)
RBC: 3.54 MIL/uL — ABNORMAL LOW (ref 3.87–5.11)
RDW: 13.6 % (ref 11.5–15.5)
WBC: 4.5 10*3/uL (ref 4.0–10.5)
nRBC: 0 % (ref 0.0–0.2)

## 2023-08-20 LAB — BASIC METABOLIC PANEL
Anion gap: 11 (ref 5–15)
BUN: 15 mg/dL (ref 8–23)
CO2: 26 mmol/L (ref 22–32)
Calcium: 9.7 mg/dL (ref 8.9–10.3)
Chloride: 91 mmol/L — ABNORMAL LOW (ref 98–111)
Creatinine, Ser: 0.92 mg/dL (ref 0.44–1.00)
GFR, Estimated: 60 mL/min — ABNORMAL LOW (ref >60)
Glucose, Bld: 106 mg/dL — ABNORMAL HIGH (ref 70–99)
Potassium: 3.3 mmol/L — ABNORMAL LOW (ref 3.5–5.1)
Sodium: 128 mmol/L — ABNORMAL LOW (ref 135–145)

## 2023-08-20 NOTE — ED Triage Notes (Signed)
Pt BIB EMS from Home due to witnessed fall. Pt fall today hitting her left hip and knee. Witnessed by husband; no LOC, no blood thinners, did not hit head. Pt reports increased left hip pain for the last 5 days. Hx of Left hip fracture and Hep C in 2018.

## 2023-08-20 NOTE — Discharge Instructions (Addendum)
Please follow-up with orthopedic specialist hypertension for you today in regards recent ER visit.  Today your x-ray does show severe arthritis in your hips is most likely causing your pain and that you will need to speak with them for long-term management.  May continue to take Tylenol every 6 hours needed for pain and ice your hips.  If symptoms change or worsen please return to ER.  I have also attached a urologist to follow-up with to have the Foley catheter evaluated.  If there are any complications with the Foley catheter he may return to the ER.

## 2023-08-20 NOTE — ED Provider Notes (Signed)
[facility] Provider Note   CSN: [id number] Arrival date & time: 08/20/23  1432     History  Chief Complaint  Patient presents with   Fall   Hip Pain   Knee Pain    Caroline Ortiz is a 89 y.o. female history of left hip osteoarthritis with protrusio deformity, hep C presented after having mechanical falls at home that have been witnessed due to left hip pain.  Patient is unable to give history is she is hard of hearing however daughter is present at bedside to provide history.  Daughter states that patient's had several mechanical falls that been witnessed at home in which she ends up landing on her butt.  Patient daughter states that she is falling because her left hip is getting increasingly worse due to pain.  Daughter states that patient has not urinated in 2 days but denies any fevers, nausea vomiting, Donnell pain, chest pain or shortness of breath.  Home Medications Prior to Admission medications   Medication Sig Start Date End Date Taking? Authorizing Provider  ALPRAZolam (XANAX) 0.25 MG tablet Take 0.25 mg by mouth 2 (two) times daily as needed for anxiety or sleep. 12/08/19  Yes [provider]  amLODipine (NORVASC) 2.5 MG tablet Take 2 tablets (5 mg total) by mouth daily. Patient taking differently: Take 2.5 mg by mouth 2 (two) times daily. 11/17/16  Yes Arby Barrette, MD  cyclobenzaprine (FLEXERIL) 5 MG tablet Take 5 mg by mouth 3 (three) times daily as needed for muscle spasms.   Yes [provider]  gabapentin (NEURONTIN) 300 MG capsule Take 300 mg by mouth 2 (two) times daily.    Yes [provider]  lisinopril (ZESTRIL) 10 MG tablet Take 10 mg by mouth daily. 12/15/19  Yes [provider]  methocarbamol (ROBAXIN) 500 MG tablet Take 500 mg by mouth 2 (two) times daily as needed for muscle spasms.   Yes [provider]  metoprolol (LOPRESSOR) 50 MG tablet Take 0.5 tablets (25 mg  total) by mouth daily. Patient taking differently: Take 50 mg by mouth daily. 11/17/16  Yes Arby Barrette, MD  traMADol (ULTRAM) 50 MG tablet Take 1 tablet (50 mg total) by mouth 2 (two) times daily as needed. Patient taking differently: Take 25 mg by mouth every 12 (twelve) hours as needed (for pain). 09/04/22  Yes Cristie Hem, PA-C  benzonatate (TESSALON) 100 MG capsule Take 1 capsule (100 mg total) by mouth every 8 (eight) hours. Patient not taking: Reported on 08/20/2023 06/14/22   Raspet, Noberto Retort, PA-C  doxycycline (VIBRAMYCIN) 100 MG capsule Take 1 capsule (100 mg total) by mouth 2 (two) times daily. Patient not taking: Reported on 08/20/2023 06/14/22   Raspet, Noberto Retort, PA-C  hydrochlorothiazide (MICROZIDE) 12.5 MG capsule Take 12.5 mg by mouth daily.    [provider]  HYDROcodone-acetaminophen (NORCO/VICODIN) 5-325 MG per tablet Take 2 tablets by mouth every 4 (four) hours as needed. Patient not taking: Reported on 08/20/2023 04/29/14   Eber Hong, MD  meloxicam (MOBIC) 7.5 MG tablet Take 1 tablet (7.5 mg total) by mouth daily. Patient not taking: Reported on 08/20/2023 10/03/21   Wallis Bamberg, PA-C      Allergies    Penicillins    Review of Systems   Review of Systems  Physical Exam Updated Vital Signs BP (!) 160/93   Pulse 73   Temp 98 F (36.7 C) (Oral)   Resp 17   SpO2  97%  Physical Exam Constitutional:      General: She is not in acute distress. HENT:     Head: Normocephalic and atraumatic.     Mouth/Throat:     Mouth: Mucous membranes are moist.  Eyes:     Extraocular Movements: Extraocular movements intact.     Conjunctiva/sclera: Conjunctivae normal.     Pupils: Pupils are equal, round, and reactive to light.  Cardiovascular:     Rate and Rhythm: Normal rate and regular rhythm.  Pulmonary:     Effort: Pulmonary effort is normal. No respiratory distress.     Breath sounds: Normal breath sounds.  Abdominal:     Palpations: Abdomen is soft.      Tenderness: There is abdominal tenderness (Suprapubic). There is no guarding or rebound.  Musculoskeletal:     Comments: 5 out of 5 bilateral hip flexion Pelvis stable Bilateral hips are nontender and no bony abnormalities were palpated Soft compartments Pain not out of proportion  Skin:    General: Skin is warm and dry.  Neurological:     Mental Status: She is alert.     Comments: Unable to obtain full neurologic exam as patient is unable to follow commands that she cannot hear me due to hearing loss     ED Results / Procedures / Treatments   Labs (all labs ordered are listed, but only abnormal results are displayed) Labs Reviewed  URINALYSIS, W/ REFLEX TO CULTURE (INFECTION SUSPECTED) - Abnormal; Notable for the following components:      Result Value   Protein, ur 30 (*)    All other components within normal limits  BASIC METABOLIC PANEL - Abnormal; Notable for the following components:   Sodium 128 (*)    Potassium 3.3 (*)    Chloride 91 (*)    Glucose, Bld 106 (*)    GFR, Estimated 60 (*)    All other components within normal limits  CBC WITH DIFFERENTIAL/PLATELET - Abnormal; Notable for the following components:   RBC 3.54 (*)    Hemoglobin 11.3 (*)    HCT 33.5 (*)    All other components within normal limits    EKG None  Radiology DG Knee 1-2 Views Right  Result Date: 08/20/2023 CLINICAL DATA:  Pain EXAM: RIGHT KNEE - 2 VIEW COMPARISON:  None Available. FINDINGS: Small joint effusion on lateral view. Small osteophytes of the patellofemoral joint and medial compartment. Slight joint space loss. Hyperostosis along the patella. There also global corticated densities in the area of the tibial tubercle. Please correlate with remote history such as Osgood-Schlatter's or other process. Scattered vascular calcifications. IMPRESSION: Degenerative and chronic appearing changes.  Small joint effusion. Electronically Signed   By: Karen Kays M.D.   On: 08/20/2023 18:05   DG  Knee 1-2 Views Left  Result Date: 08/20/2023 CLINICAL DATA:  Pain EXAM: LEFT KNEE - 2 VIEW COMPARISON:  None Available. FINDINGS: Moderate joint space loss of the medial compartment with small osteophytes. Chondrocalcinosis. Small osteophytes of the patellofemoral joint. Hyperostosis along the dorsal aspect of the patella. Osteopenia. No fracture or dislocation. Scattered vascular calcifications. IMPRESSION: Degenerative changes of the knee, most severe involving the medial compartment. Osteopenia. Chondrocalcinosis. Electronically Signed   By: Karen Kays M.D.   On: 08/20/2023 18:04   DG Hip Unilat W or Wo Pelvis 2-3 Views Left  Result Date: 08/20/2023 CLINICAL DATA:  Pain after fall EXAM: DG HIP (WITH OR WITHOUT PELVIS) 2V LEFT COMPARISON:  Pelvis x-ray 06/30/2022 and hip FINDINGS:  Lateral view is very limited with overlapping artifacts. Osteopenia. Severe joint space loss of the left hip with sclerosis and acetabular protrusio. Some loss of roundness to the femoral head. Appearance is similar to previous. Vascular calcifications are seen. With this level of osteopenia, if there is further concern of injury additional workup with CT could be performed as clinically appropriate. IMPRESSION: Severe degenerative changes of the left hip with acetabular protrusio. Similar to previous. Electronically Signed   By: Karen Kays M.D.   On: 08/20/2023 18:02   DG Hip Unilat W or Wo Pelvis 2-3 Views Right  Result Date: 08/20/2023 CLINICAL DATA:  Fall.  Hip pain. EXAM: DG HIP (WITH OR WITHOUT PELVIS) 2-3V RIGHT COMPARISON:  Left hip radiographs and CT pelvis 06/30/2022 FINDINGS: The right hip is located. Moderate right hip osteoarthrosis is again noted with prominent joint space narrowing and marginal osteophytosis. No definite acute fracture is identified. Markedly severe left hip osteoarthrosis is again noted. There are atherosclerotic vascular calcifications. Disc degeneration is noted in the included lower  lumbar spine. IMPRESSION: 1. No acute osseous abnormality identified. 2. Moderate right and severe left hip osteoarthrosis. Electronically Signed   By: Sebastian Ache M.D.   On: 08/20/2023 17:00    Procedures Procedures    Medications Ordered in ED Medications - No data to display  ED Course/ Medical Decision Making/ A&P                                 Medical Decision Making Amount and/or Complexity of Data Reviewed Labs: ordered. Radiology: ordered.   RUE KRABBENHOFT 89 y.o. presented today for left hip pain and urinary retention. Working DDx that I considered at this time includes, but not limited to, fracture, septic arthritis, mechanical falls, UTI, AKI, urinary retention, electrolyte abnormalities.  R/o DDx: septic arthritis, UTI, AKI, : These are considered less likely due to history of present illness, physical exam, labs/imaging findings  Review of prior external notes: 06/09/2023 office visit  Unique Tests and My Interpretation:  CBC: Unremarkable BMP: Mild hyponatremia 128 UA: Unremarkable Bladder scan: 720 cc Right knee x-ray: Small effusion, arthritic changes Left knee x-ray:Osteopenia, Chondrocalcinosis, arthritic changes Left hip x-ray: Severe changes in the left hip with acetabular protrusio Right hip x-ray: Moderate right-sided arthritis  Social Determinants of Health: none  Discussion with Independent Historian:  Daughter  Discussion of Management of Tests: None  Risk: Low: based on diagnostic testing/clinical impression and treatment plan  Risk Stratification Score: None  Staffed with Earlene Plater, MD  Plan: On exam patient was in no acute distress stable vitals.  On exam patient did not have any tenderness in her hips but did have suprapubic tenderness and daughter states she has not urinated in 2 days.  Will obtain x-ray imaging of hips and knees as patient is complaining of pain in them.  Patient's had multiple mechanical falls over the past few days  due to left hip pain and sulcus sign do not need further workup for her falls as these are mechanical.  UA and basic labs also be ordered to rule out UTI and check kidney function since patient without urinating.  Bladder scan does show 720 cc present and so we will place Foley catheter as patient is unable to urinate here.  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  Patient's labs are reassuring.  Patient sodium was slightly low at 128 however patient has a decreased appetite according to the daughters but still eating and so do suspect this is the cause of it.  I spoke to her daughters and they do not believe patient needs SNF placement and if patient is able to ambulate at her baseline she can be discharged with orthopedic follow-up.  Daughter states that they feel safe with patient going home as patient does live at home with her husband and one of her 5 daughters at all times and they feel that they can watch her safely.  Patient is able to ambulate at baseline and is requesting to be discharged.  Will discharge with orthopedic follow-up.  Encouraged Tylenol every 6 hours needed for pain for her hip and to ice her hip.  Patient also had Foley placed due to urinary retention and so we will have her follow-up with urology as well.  Patient was given return precautions. Patient stable for discharge at this time.  Patient verbalized understanding of plan.  This chart was dictated using voice recognition software.  Despite best efforts to proofread,  errors can occur which can change the documentation meaning.         Final Clinical Impression(s) / ED Diagnoses Final diagnoses:  Fall, initial encounter  Left hip pain  Urinary retention    Rx / DC Orders ED Discharge Orders     None         Caroline Ortiz 08/20/23 2230     Laurence Spates, MD 08/21/23 [phone]

## 2023-10-11 ENCOUNTER — Encounter (HOSPITAL_COMMUNITY): Payer: Self-pay

## 2023-10-11 ENCOUNTER — Other Ambulatory Visit: Payer: Self-pay

## 2023-10-11 ENCOUNTER — Emergency Department (HOSPITAL_COMMUNITY): Payer: Medicare Other

## 2023-10-11 ENCOUNTER — Inpatient Hospital Stay (HOSPITAL_COMMUNITY)
Admission: EM | Admit: 2023-10-11 | Discharge: 2023-10-13 | DRG: 435 | Disposition: A | Discharge status: Hospice | Payer: Medicare Other | Attending: Internal Medicine | Admitting: Internal Medicine

## 2023-10-11 DIAGNOSIS — Z515 Encounter for palliative care: Secondary | ICD-10-CM

## 2023-10-11 DIAGNOSIS — Z66 Do not resuscitate: Secondary | ICD-10-CM | POA: Diagnosis present

## 2023-10-11 DIAGNOSIS — Z7189 Other specified counseling: Secondary | ICD-10-CM | POA: Diagnosis not present

## 2023-10-11 DIAGNOSIS — E7800 Pure hypercholesterolemia, unspecified: Secondary | ICD-10-CM | POA: Diagnosis present

## 2023-10-11 DIAGNOSIS — E860 Dehydration: Secondary | ICD-10-CM | POA: Diagnosis present

## 2023-10-11 DIAGNOSIS — K831 Obstruction of bile duct: Secondary | ICD-10-CM | POA: Diagnosis present

## 2023-10-11 DIAGNOSIS — Z1152 Encounter for screening for COVID-19: Secondary | ICD-10-CM

## 2023-10-11 DIAGNOSIS — R296 Repeated falls: Secondary | ICD-10-CM | POA: Diagnosis present

## 2023-10-11 DIAGNOSIS — R64 Cachexia: Secondary | ICD-10-CM | POA: Diagnosis present

## 2023-10-11 DIAGNOSIS — I10 Essential (primary) hypertension: Secondary | ICD-10-CM | POA: Diagnosis present

## 2023-10-11 DIAGNOSIS — J910 Malignant pleural effusion: Secondary | ICD-10-CM | POA: Diagnosis present

## 2023-10-11 DIAGNOSIS — C787 Secondary malignant neoplasm of liver and intrahepatic bile duct: Secondary | ICD-10-CM | POA: Diagnosis present

## 2023-10-11 DIAGNOSIS — Z6821 Body mass index (BMI) 21.0-21.9, adult: Secondary | ICD-10-CM | POA: Diagnosis not present

## 2023-10-11 DIAGNOSIS — G8929 Other chronic pain: Secondary | ICD-10-CM | POA: Diagnosis present

## 2023-10-11 DIAGNOSIS — R531 Weakness: Principal | ICD-10-CM

## 2023-10-11 DIAGNOSIS — E876 Hypokalemia: Secondary | ICD-10-CM | POA: Diagnosis present

## 2023-10-11 DIAGNOSIS — Z9181 History of falling: Secondary | ICD-10-CM

## 2023-10-11 DIAGNOSIS — M1612 Unilateral primary osteoarthritis, left hip: Secondary | ICD-10-CM | POA: Diagnosis present

## 2023-10-11 DIAGNOSIS — K8689 Other specified diseases of pancreas: Secondary | ICD-10-CM | POA: Diagnosis present

## 2023-10-11 DIAGNOSIS — R17 Unspecified jaundice: Secondary | ICD-10-CM

## 2023-10-11 DIAGNOSIS — C259 Malignant neoplasm of pancreas, unspecified: Secondary | ICD-10-CM | POA: Diagnosis present

## 2023-10-11 DIAGNOSIS — E871 Hypo-osmolality and hyponatremia: Secondary | ICD-10-CM | POA: Diagnosis present

## 2023-10-11 DIAGNOSIS — Z79899 Other long term (current) drug therapy: Secondary | ICD-10-CM

## 2023-10-11 DIAGNOSIS — N131 Hydronephrosis with ureteral stricture, not elsewhere classified: Secondary | ICD-10-CM | POA: Diagnosis present

## 2023-10-11 DIAGNOSIS — R54 Age-related physical debility: Secondary | ICD-10-CM | POA: Diagnosis present

## 2023-10-11 DIAGNOSIS — Z880 Allergy status to penicillin: Secondary | ICD-10-CM | POA: Diagnosis not present

## 2023-10-11 DIAGNOSIS — R627 Adult failure to thrive: Secondary | ICD-10-CM | POA: Insufficient documentation

## 2023-10-11 LAB — I-STAT CG4 LACTIC ACID, ED
Lactic Acid, Venous: 2.3 mmol/L (ref 0.5–1.9)
Lactic Acid, Venous: 1.9 mmol/L (ref 0.5–1.9)

## 2023-10-11 LAB — COMPREHENSIVE METABOLIC PANEL
ALT: 34 U/L (ref 0–44)
AST: 115 U/L — ABNORMAL HIGH (ref 15–41)
Albumin: 2.8 g/dL — ABNORMAL LOW (ref 3.5–5.0)
Alkaline Phosphatase: 150 U/L — ABNORMAL HIGH (ref 38–126)
Anion gap: 12 (ref 5–15)
BUN: 9 mg/dL (ref 8–23)
CO2: 25 mmol/L (ref 22–32)
Calcium: 8.7 mg/dL — ABNORMAL LOW (ref 8.9–10.3)
Chloride: 93 mmol/L — ABNORMAL LOW (ref 98–111)
Creatinine, Ser: 0.67 mg/dL (ref 0.44–1.00)
GFR, Estimated: >60 mL/min (ref >60)
Glucose, Bld: 106 mg/dL — ABNORMAL HIGH (ref 70–99)
Potassium: 3.1 mmol/L — ABNORMAL LOW (ref 3.5–5.1)
Sodium: 130 mmol/L — ABNORMAL LOW (ref 135–145)
Total Bilirubin: 4.5 mg/dL — ABNORMAL HIGH (ref 0.0–1.2)
Total Protein: 6.8 g/dL (ref 6.5–8.1)

## 2023-10-11 LAB — CBC WITH DIFFERENTIAL/PLATELET
Abs Immature Granulocytes: 0.04 10*3/uL (ref 0.00–0.07)
Basophils Absolute: 0 10*3/uL (ref 0.0–0.1)
Basophils Relative: 0 %
Eosinophils Absolute: 0 10*3/uL (ref 0.0–0.5)
Eosinophils Relative: 0 %
HCT: 31.7 % — ABNORMAL LOW (ref 36.0–46.0)
Hemoglobin: 10.6 g/dL — ABNORMAL LOW (ref 12.0–15.0)
Immature Granulocytes: 1 %
Lymphocytes Relative: 8 %
Lymphs Abs: 0.7 10*3/uL (ref 0.7–4.0)
MCH: 32.2 pg (ref 26.0–34.0)
MCHC: 33.4 g/dL (ref 30.0–36.0)
MCV: 96.4 fL (ref 80.0–100.0)
Monocytes Absolute: 0.7 10*3/uL (ref 0.1–1.0)
Monocytes Relative: 9 %
Neutro Abs: 6.5 10*3/uL (ref 1.7–7.7)
Neutrophils Relative %: 82 %
Platelets: 185 10*3/uL (ref 150–400)
RBC: 3.29 MIL/uL — ABNORMAL LOW (ref 3.87–5.11)
RDW: 15.5 % (ref 11.5–15.5)
WBC: 8 10*3/uL (ref 4.0–10.5)
nRBC: 0 % (ref 0.0–0.2)

## 2023-10-11 LAB — RESP PANEL BY RT-PCR (RSV, FLU A&B, COVID)  RVPGX2
Influenza A by PCR: NEGATIVE
Influenza B by PCR: NEGATIVE
Resp Syncytial Virus by PCR: NEGATIVE
SARS Coronavirus 2 by RT PCR: NEGATIVE

## 2023-10-11 LAB — LIPASE, BLOOD: Lipase: 340 U/L — ABNORMAL HIGH (ref 11–51)

## 2023-10-11 LAB — AMMONIA: Ammonia: 18 umol/L (ref 9–35)

## 2023-10-11 LAB — CBG MONITORING, ED: Glucose-Capillary: 89 mg/dL (ref 70–99)

## 2023-10-11 MED ORDER — IOHEXOL 300 MG/ML  SOLN
100.0000 mL | Freq: Once | INTRAMUSCULAR | Status: AC | PRN
Start: 2023-10-11 — End: 2023-10-11
  Administered 2023-10-11: 100 mL via INTRAVENOUS

## 2023-10-11 MED ORDER — POLYETHYLENE GLYCOL 3350 17 G PO PACK: 17.0000 g | PACK | Freq: Every day | ORAL | Status: DC | PRN

## 2023-10-11 MED ORDER — GABAPENTIN 300 MG PO CAPS
300.0000 mg | ORAL_CAPSULE | Freq: Three times a day (TID) | ORAL | Status: DC
  Administered 2023-10-11 – 2023-10-13 (×3): 300 mg via ORAL
  Filled 2023-10-11 (×4): qty 1

## 2023-10-11 MED ORDER — MORPHINE SULFATE (PF) 2 MG/ML IV SOLN: 2.0000 mg | INTRAVENOUS | Status: DC | PRN

## 2023-10-11 MED ORDER — POTASSIUM CHLORIDE 10 MEQ/100ML IV SOLN
10.0000 meq | Freq: Once | INTRAVENOUS | Status: AC
  Administered 2023-10-11: 10 meq via INTRAVENOUS
  Filled 2023-10-11: qty 100

## 2023-10-11 MED ORDER — METHOCARBAMOL 500 MG PO TABS: 500.0000 mg | ORAL_TABLET | Freq: Two times a day (BID) | ORAL | Status: DC | PRN

## 2023-10-11 MED ORDER — ONDANSETRON HCL 4 MG/2ML IJ SOLN: 4.0000 mg | Freq: Four times a day (QID) | INTRAMUSCULAR | Status: DC | PRN

## 2023-10-11 MED ORDER — ACETAMINOPHEN 325 MG PO TABS: 650.0000 mg | ORAL_TABLET | Freq: Four times a day (QID) | ORAL | Status: DC | PRN

## 2023-10-11 MED ORDER — ENOXAPARIN SODIUM 40 MG/0.4ML IJ SOSY
40.0000 mg | PREFILLED_SYRINGE | INTRAMUSCULAR | Status: DC
  Administered 2023-10-11 – 2023-10-12 (×2): 40 mg via SUBCUTANEOUS
  Filled 2023-10-11 (×2): qty 0.4

## 2023-10-11 MED ORDER — METOPROLOL TARTRATE 50 MG PO TABS
50.0000 mg | ORAL_TABLET | Freq: Every day | ORAL | Status: DC
  Administered 2023-10-12: 50 mg via ORAL
  Filled 2023-10-11 (×2): qty 1

## 2023-10-11 MED ORDER — SODIUM CHLORIDE 0.9% FLUSH: 3.0000 mL | INTRAVENOUS | Status: DC | PRN

## 2023-10-11 MED ORDER — KCL IN DEXTROSE-NACL 20-5-0.9 MEQ/L-%-% IV SOLN
INTRAVENOUS | Status: AC
Start: 2023-10-11 — End: 2023-10-12
  Filled 2023-10-11 (×2): qty 1000

## 2023-10-11 MED ORDER — HYDRALAZINE HCL 20 MG/ML IJ SOLN: 5.0000 mg | Freq: Four times a day (QID) | INTRAMUSCULAR | Status: DC | PRN

## 2023-10-11 MED ORDER — SODIUM CHLORIDE 0.9 % IV BOLUS
500.0000 mL | Freq: Once | INTRAVENOUS | Status: AC
Start: 2023-10-11 — End: 2023-10-11
  Administered 2023-10-11: 500 mL via INTRAVENOUS

## 2023-10-11 MED ORDER — AMLODIPINE BESYLATE 5 MG PO TABS
2.5000 mg | ORAL_TABLET | Freq: Every day | ORAL | Status: DC
  Administered 2023-10-12: 2.5 mg via ORAL
  Filled 2023-10-11 (×2): qty 1

## 2023-10-11 MED ORDER — DOCUSATE SODIUM 100 MG PO CAPS
100.0000 mg | ORAL_CAPSULE | Freq: Two times a day (BID) | ORAL | Status: DC
  Administered 2023-10-11 – 2023-10-13 (×4): 100 mg via ORAL
  Filled 2023-10-11 (×4): qty 1

## 2023-10-11 MED ORDER — SODIUM CHLORIDE 0.9% FLUSH
3.0000 mL | Freq: Two times a day (BID) | INTRAVENOUS | Status: DC
  Administered 2023-10-12 (×2): 3 mL via INTRAVENOUS

## 2023-10-11 MED ORDER — ACETAMINOPHEN 650 MG RE SUPP: 650.0000 mg | Freq: Four times a day (QID) | RECTAL | Status: DC | PRN

## 2023-10-11 MED ORDER — ALPRAZOLAM 0.25 MG PO TABS: 0.2500 mg | ORAL_TABLET | Freq: Two times a day (BID) | ORAL | Status: DC | PRN

## 2023-10-11 MED ORDER — SODIUM CHLORIDE 0.9 % IV SOLN: INTRAVENOUS | Status: DC

## 2023-10-11 MED ORDER — HYDROCODONE-ACETAMINOPHEN 5-325 MG PO TABS
1.0000 | ORAL_TABLET | ORAL | Status: DC | PRN
  Administered 2023-10-11 – 2023-10-12 (×2): 1 via ORAL
  Filled 2023-10-11 (×2): qty 1

## 2023-10-11 MED ORDER — SODIUM CHLORIDE 0.9 % IV SOLN
250.0000 mL | INTRAVENOUS | Status: AC | PRN
Start: 2023-10-11 — End: 2023-10-12

## 2023-10-11 MED ORDER — SODIUM CHLORIDE 0.9 % IV SOLN: Freq: Once | INTRAVENOUS | Status: AC

## 2023-10-11 MED ORDER — POTASSIUM CHLORIDE 2 MEQ/ML IV SOLN: INTRAVENOUS | Status: DC

## 2023-10-11 MED ORDER — ONDANSETRON HCL 4 MG PO TABS: 4.0000 mg | ORAL_TABLET | Freq: Four times a day (QID) | ORAL | Status: DC | PRN

## 2023-10-11 NOTE — ED Notes (Signed)
CBG 89 

## 2023-10-11 NOTE — ED Provider Notes (Signed)
Caroline Ortiz Provider Note   CSN: [id number] Arrival date & time: 10/11/23  1525     History  Chief Complaint  Patient presents with   Weakness    Caroline Ortiz is a 89 y.o. female.  89 year old female with prior medical history as detailed below presents for evaluation.  Family reports decreased p.o. intake, weakness.  Symptoms began over the weekend.  1 episode of vomiting Saturday.  No fever reported.  No shortness of breath or cough reported  The history is provided by the patient, medical records and a relative.       Home Medications Prior to Admission medications   Medication Sig Start Date End Date Taking? Authorizing Provider  ALPRAZolam (XANAX) 0.25 MG tablet Take 0.25 mg by mouth 2 (two) times daily as needed for anxiety or sleep. 12/08/19   [provider]  amLODipine (NORVASC) 2.5 MG tablet Take 2 tablets (5 mg total) by mouth daily. Patient taking differently: Take 2.5 mg by mouth 2 (two) times daily. 11/17/16   Arby Barrette, MD  benzonatate (TESSALON) 100 MG capsule Take 1 capsule (100 mg total) by mouth every 8 (eight) hours. Patient not taking: Reported on 08/20/2023 06/14/22   Raspet, Noberto Retort, PA-C  cyclobenzaprine (FLEXERIL) 5 MG tablet Take 5 mg by mouth 3 (three) times daily as needed for muscle spasms.    [provider]  doxycycline (VIBRAMYCIN) 100 MG capsule Take 1 capsule (100 mg total) by mouth 2 (two) times daily. Patient not taking: Reported on 08/20/2023 06/14/22   Raspet, Denny Peon K, PA-C  gabapentin (NEURONTIN) 300 MG capsule Take 300 mg by mouth 2 (two) times daily.     [provider]  hydrochlorothiazide (MICROZIDE) 12.5 MG capsule Take 12.5 mg by mouth daily.    [provider]  HYDROcodone-acetaminophen (NORCO/VICODIN) 5-325 MG per tablet Take 2 tablets by mouth every 4 (four) hours as needed. Patient not taking: Reported on 08/20/2023 04/29/14   Eber Hong, MD   lisinopril (ZESTRIL) 10 MG tablet Take 10 mg by mouth daily. 12/15/19   [provider]  meloxicam (MOBIC) 7.5 MG tablet Take 1 tablet (7.5 mg total) by mouth daily. Patient not taking: Reported on 08/20/2023 10/03/21   Wallis Bamberg, PA-C  methocarbamol (ROBAXIN) 500 MG tablet Take 500 mg by mouth 2 (two) times daily as needed for muscle spasms.    [provider]  metoprolol (LOPRESSOR) 50 MG tablet Take 0.5 tablets (25 mg total) by mouth daily. Patient taking differently: Take 50 mg by mouth daily. 11/17/16   Arby Barrette, MD  traMADol (ULTRAM) 50 MG tablet Take 1 tablet (50 mg total) by mouth 2 (two) times daily as needed. Patient taking differently: Take 25 mg by mouth every 12 (twelve) hours as needed (for pain). 09/04/22   Cristie Hem, PA-C      Allergies    Penicillins    Review of Systems   Review of Systems  All other systems reviewed and are negative.   Physical Exam Updated Vital Signs BP 131/84 (BP Location: Right Arm)   Pulse 72   Temp 98 F (36.7 C) (Oral)   Resp 14   Ht 5\' 6"  (1.676 m)   SpO2 100%   BMI 21.63 kg/m  Physical Exam Vitals and nursing note reviewed.  Constitutional:      General: She is not in acute distress.    Appearance: Normal appearance. She is well-developed.  HENT:  Head: Normocephalic and atraumatic.  Eyes:     General: Scleral icterus present.     Extraocular Movements: Extraocular movements intact.     Pupils: Pupils are equal, round, and reactive to light.  Cardiovascular:     Rate and Rhythm: Normal rate and regular rhythm.     Heart sounds: Normal heart sounds.  Pulmonary:     Effort: Pulmonary effort is normal. No respiratory distress.     Breath sounds: Normal breath sounds.  Abdominal:     General: There is no distension.     Palpations: Abdomen is soft.     Tenderness: There is abdominal tenderness.     Comments: Mild right upper quadrant tenderness with palpation  Musculoskeletal:         General: No deformity. Normal range of motion.     Cervical back: Normal range of motion and neck supple.  Skin:    General: Skin is warm and dry.  Neurological:     General: No focal deficit present.     Mental Status: She is alert and oriented to person, place, and time.     ED Results / Procedures / Treatments   Labs (all labs ordered are listed, but only abnormal results are displayed) Labs Reviewed  RESP PANEL BY RT-PCR (RSV, FLU A&B, COVID)  [id number]  CULTURE, BLOOD (ROUTINE X 2)  CULTURE, BLOOD (ROUTINE X 2)  URINALYSIS, W/ REFLEX TO CULTURE (INFECTION SUSPECTED)  CBC WITH DIFFERENTIAL/PLATELET  LIPASE, BLOOD  COMPREHENSIVE METABOLIC PANEL  CBG MONITORING, ED  I-STAT CG4 LACTIC ACID, ED    EKG None  Radiology No results found.  Procedures Procedures    Medications Ordered in ED Medications  sodium chloride 0.9 % bolus 500 mL (has no administration in time range)    ED Course/ Medical Decision Making/ A&P                                 Medical Decision Making Amount and/or Complexity of Data Reviewed Labs: ordered. Radiology: ordered.  Risk Prescription drug management. Decision regarding hospitalization.    Medical Screen Complete  This patient presented to the ED with complaint of weakness, decreased p.o. intake.  This complaint involves an extensive number of treatment options. The initial differential diagnosis includes, but is not limited to, metabolic abnormality, AKI, bowel obstruction, cholecystitis, cholangitis, pancreatic mass, other intra-abdominal process, etc.  This presentation is: Acute, Chronic, Self-Limited, Previously Undiagnosed, Uncertain Prognosis, Complicated, Systemic Symptoms, and Threat to Life/Bodily Function  Patient is presenting with generalized weakness and fatigue.  Patient with mild scleral icterus present on exam and associated right upper quadrant pain.  Initial labs demonstrate alkaline phosphate at 150, total  bili of 4.5, lipase of 340.  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  Hospitalist service is aware of case.  Patient will require admission for further workup and treatment planning.  Additional history obtained:  Additional history obtained from [facility] External records from outside sources obtained and reviewed including prior ED visits and prior Inpatient records.    Lab Tests:  I ordered and personally interpreted labs.  The pertinent results include: CBC, CMP, lipase, ammonia, UA, COVID, flu, RSV, lactic acid   Imaging Studies ordered:  I ordered imaging studies including CT head, CT abdomen pelvis, chest x-ray I independently visualized and interpreted obtained imaging which showed likely pancreatic mass with likely metastatic disease in liver I  agree with the radiologist interpretation.   Cardiac Monitoring:  The patient was maintained on a cardiac monitor.  I personally viewed and interpreted the cardiac monitor which showed an underlying rhythm of: NSR   Medicines ordered:  I ordered medication including IV fluids for suspected dehydration Reevaluation of the patient after these medicines showed that the patient: improved  Problem List / ED Course:  Pancreatic mass   Reevaluation:  After the interventions noted above, I reevaluated the patient and found that they have: stayed the same    Disposition:  After consideration of the diagnostic results and the patients response to treatment, I feel that the patent would benefit from admission.          Final Clinical Impression(s) / ED Diagnoses Final diagnoses:  Weakness  Jaundice  Pancreatic mass    Rx / DC Orders ED Discharge Orders     None         Wynetta Fines, MD 10/11/23 2145

## 2023-10-11 NOTE — H&P (Addendum)
[facility] History and Physical  Caroline Ortiz:[id number] DOB: July 27, 1932 DOA: 10/11/2023  Referring physician: ED  PCP: Garlan Fillers, MD   Patient is coming from: Home  Chief Complaint: Fatigue, weakness, decreased oral intake  HPI:  Patient is 89 years old female with past medical history of hepatitis C, congestive heart failure, hypertension, chronic left hip pain osteoarthritis and history of multiple falls was brought in by the family, with generalized weakness, fatigue, decreased oral intake and lethargy largely since Saturday.  Patient initially had some vomiting but has not vomited since Saturday.  Patient's family reported that the patient has been losing weight since Thanksgiving has had minimal intake due to  decreased appetite.  She had been complaining of intermittent epigastric pain especially after eating certain food.  Of note patient does have history of chronic hip pain and falls and had been seen in the ED in the past.  Normally, patient was able to ambulate with the help of a walker but this time has not been able to do much.  Patient usually feels constipated and last bowel movement was yesterday which was hard in consistency.  Has mild cough without congestion or dyspnea or shortness of breath.  Family reported that she has been more sleepy recently with increasing fatigue and weakness.  Denies any urinary urgency frequency or dysuria but has been having dark cola colored urine with light stool yesterday.  No recent travel or sick contacts.  History of hepatitis C in the past.  In the ED, patient's vitals were within normal range.  Initial labs showed a lactic acid elevated at 2.3 followed by 1.9.  CBC showed hemoglobin low at 13.6 with no leukocytosis.  Blood cultures were sent from the ED.  COVID influenza and RSV was negative.  BMP notable for sodium low at 130 and potassium of 3.1.  Albumin is low at 2.8.  Bilirubin was elevated at 4.5.  Alkaline  phosphatase 150.  Chest x-ray done in the ED showed right lung base scar atelectasis.  CT head scan was negative for acute findings.  CT scan of the abdomen pelvis with contrast showed 2.2 x 1.8 cm low-density mass in the pancreatic head concerning for pancreatic cancer with associated biliary and pancreatic duct dilatation and gallbladder distention.  Multiple low-density lesions were noted in the liver compatible with metastasis.  Patient also had right hydronephrosis with UPJ obstruction.  In the ED, patient received IV fluids, potassium and was considered for admission to the hospital.    Assessment and Plan Principal Problem:   Generalized weakness Active Problems:   Essential hypertension   Primary osteoarthritis of left hip   Pancreatic mass   Failure to thrive in adult   Obstructive jaundice   Pancreatic mass with obstructive jaundice. Right-sided hydronephrosis. CT abdomen pelvis with 2 x 2 pancreatic mass metastasis in the liver, lipase of 340.Tenderness over the epigastric region with some icterus.  Bilirubin up to 4.5.  Will get direct bilirubin, MRI of the abdomen and pelvis.  Creatinine of 0.6 at this time.  Hydronephrosis could be secondary to UPJ obstruction from possible extensive mass.  Family at bedside has agreed to proceed with MRI of the abdomen but hold off with GI or biopsy at this time.  Family leaning towards palliative care rather than aggressive treatment at this time.  Will get palliative care consultation as well.  Hyponatremia.  Appeared to be mildly volume depleted.  Will continue with D5 normal saline due to poor  oral intake.. Check BMP in AM.  Volume depletion.  Lactate has improved with hydration.  Continue with IV fluids.  Encourage oral hydration.  Will put the patient on liquid diet today.  Hypokalemia.  Potassium of 3.1 on presentation.  Will replenish.  Check levels in AM.  Will continue with D5 with normal saline with KCl.  Received 10 mEq of IV KCl in  the ED.  Check levels in AM. . History of hepatitis C Seen by GI group at [facility] in the past.    Ammonia level was 18.  Check CMP in AM.  Check MRI of the abdomen/liver.  Left hip pain from osteoarthritis.   Multiple falls in the past. Will get PT OT evaluation.  Consult TOC for home health needs.  Decreased p.o. intake, generalized weakness, failure to thrive, significant weight loss.. Will get PT OT evaluation.  Consult nutrition services.  Patient does have significant weight loss.  Overall poor prognosis.  History of congestive heart failure.  Currently volume depleted.  Continue IV fluids.  Not on diuretic at home.  Hypertension On amlodipine 2.5 mg, lisinopril and metoprolol at home.  Continue amlodipine and metoprolol for now.  DVT Prophylaxis: Lovenox subcu  Review of Systems:  All systems were reviewed and were negative unless otherwise mentioned in the HPI   Past Medical History:  Diagnosis Date   CHF (congestive heart failure) (HCC)    Hep C w/ coma, chronic    High cholesterol    Hypertension    Past Surgical History:  Procedure Laterality Date   HYSTEROTOMY     KNEE SURGERY      Social History:  reports that she has never smoked. She has never used smokeless tobacco. She reports that she does not drink alcohol and does not use drugs.  Allergies  Allergen Reactions   Penicillins Hives, Swelling, Rash and Other (See Comments)    DID THE REACTION INVOLVE: Swelling of the face/tongue/throat, SOB, or low BP? Yes, Sudden or severe rash/hives, skin peeling, or the inside of the mouth or nose? no, Did it require medical treatment? No, When did it last happen?    younger  , If all above answers are "NO", may proceed with cephalosporin use.    Family History  Problem Relation Age of Onset   Stomach cancer Mother    Prostate cancer Father    Breast cancer Sister    Prostate cancer Brother    Breast cancer Daughter    Stomach cancer Daughter      Prior to  Admission medications   Medication Sig Start Date End Date Taking? Authorizing Provider  ALPRAZolam (XANAX) 0.25 MG tablet Take 0.25 mg by mouth 2 (two) times daily as needed for anxiety or sleep. 12/08/19  Yes [provider]  amLODipine (NORVASC) 2.5 MG tablet Take 2.5 mg by mouth daily.   Yes [provider]  cyclobenzaprine (FLEXERIL) 5 MG tablet Take 5 mg by mouth 3 (three) times daily as needed for muscle spasms.   Yes [provider]  gabapentin (NEURONTIN) 300 MG capsule Take 300 mg by mouth 3 (three) times daily.   Yes [provider]  lisinopril (ZESTRIL) 10 MG tablet Take 10 mg by mouth daily.   Yes [provider]  metoprolol tartrate (LOPRESSOR) 50 MG tablet Take 50 mg by mouth daily.   Yes [provider]  traMADol (ULTRAM) 50 MG tablet Take 1 tablet (50 mg total) by mouth 2 (two) times daily as needed. Patient  taking differently: Take 25 mg by mouth every 12 (twelve) hours as needed for moderate pain (pain score 4-6). 09/04/22  Yes Cristie Hem, PA-C  methocarbamol (ROBAXIN) 500 MG tablet Take 500 mg by mouth 2 (two) times daily as needed for muscle spasms. Patient not taking: Reported on 10/11/2023    [provider]    Physical Exam:  Vitals:   10/11/23 1730 10/11/23 1800 10/11/23 1830 10/11/23 1932  BP: (!) 141/82 133/83 139/77   Pulse: 76 67 68 77  Resp: (!) 22 18 (!) 23 18  Temp:    97.9 F (36.6 C)  TempSrc:    Oral  SpO2: 100% 100% 100% 100%  Height:       Wt Readings from Last 3 Encounters:  04/24/20 60.8 kg  01/25/20 60.8 kg  12/18/19 62.7 kg   Body mass index is 21.63 kg/m.  General: Thinly built, not in obvious distress, elderly female, appears frail chronically ill and deconditioned. HENT: Normocephalic, No scleral pallor or icterus noted. Oral mucosa is slightly dry, Chest:  Clear breath sounds.  . No crackles or wheezes.  CVS: S1 &S2 heard. No murmur.  Regular rate and rhythm. Abdomen:  Soft, mild nonspecific epigastric tenderness noted, nondistended.  Bowel sounds are heard.  Extremities: No cyanosis, clubbing or edema.  Thin extremities, left hip tenderness on palpation. Psych: Alert, awake and Communicative, mildly somnolent, CNS:  No cranial nerve deficits.  Generalized weakness noted, moves all extremities. Skin: Warm and dry.  Decreased skin turgor noted.  Labs on Admission:   CBC: Recent Labs  Lab 10/11/23 1636  WBC 8.0  NEUTROABS 6.5  HGB 10.6*  HCT 31.7*  MCV 96.4  PLT 185    Basic Metabolic Panel: Recent Labs  Lab 10/11/23 1636  NA 130*  K 3.1*  CL 93*  CO2 25  GLUCOSE 106*  BUN 9  CREATININE 0.67  CALCIUM 8.7*    Liver Function Tests: Recent Labs  Lab 10/11/23 1636  AST 115*  ALT 34  ALKPHOS 150*  BILITOT 4.5*  PROT 6.8  ALBUMIN 2.8*   Recent Labs  Lab 10/11/23 1636  LIPASE 340*   Recent Labs  Lab 10/11/23 1636  AMMONIA 18    Cardiac Enzymes: No results for input(s): "CKTOTAL", "CKMB", "CKMBINDEX", "TROPONINI" in the last 168 hours.  BNP (last 3 results) No results for input(s): "BNP" in the last 8760 hours.  ProBNP (last 3 results) No results for input(s): "PROBNP" in the last 8760 hours.  CBG: Recent Labs  Lab 10/11/23 1608  GLUCAP 89    Lipase     Component Value Date/Time   LIPASE 340 (H) 10/11/2023 1636     Urinalysis    Component Value Date/Time   COLORURINE YELLOW 08/20/2023 1533   APPEARANCEUR CLEAR 08/20/2023 1533   LABSPEC 1.008 08/20/2023 1533   PHURINE 7.0 08/20/2023 1533   GLUCOSEU NEGATIVE 08/20/2023 1533   HGBUR NEGATIVE 08/20/2023 1533   BILIRUBINUR NEGATIVE 08/20/2023 1533   KETONESUR NEGATIVE 08/20/2023 1533   PROTEINUR 30 (A) 08/20/2023 1533   UROBILINOGEN 1.0 04/29/2014 0443   NITRITE NEGATIVE 08/20/2023 1533   LEUKOCYTESUR NEGATIVE 08/20/2023 1533     Drugs of Abuse  No results found for: "LABOPIA", "COCAINSCRNUR", "LABBENZ", "AMPHETMU", "THCU", "LABBARB"    Radiological  Exams on Admission: CT Head Wo Contrast Result Date: 10/11/2023 CLINICAL DATA:  Altered mental status EXAM: CT HEAD WITHOUT CONTRAST TECHNIQUE: Contiguous axial images were obtained from the base of the skull through the vertex  without intravenous contrast. RADIATION DOSE REDUCTION: This exam was performed according to the departmental dose-optimization program which includes automated exposure control, adjustment of the mA and/or kV according to patient size and/or use of iterative reconstruction technique. COMPARISON:  04/18/2016 FINDINGS: Brain: No acute intracranial abnormality. Specifically, no hemorrhage, hydrocephalus, mass lesion, acute infarction, or significant intracranial injury. Vascular: No hyperdense vessel or unexpected calcification. Skull: No acute calvarial abnormality. Sinuses/Orbits: No acute findings Other: None IMPRESSION: No acute intracranial abnormality. Electronically Signed   By: Charlett Nose M.D.   On: 10/11/2023 19:48   CT ABDOMEN PELVIS W CONTRAST Result Date: 10/11/2023 CLINICAL DATA:  Abdominal pain, vomiting EXAM: CT ABDOMEN AND PELVIS WITH CONTRAST TECHNIQUE: Multidetector CT imaging of the abdomen and pelvis was performed using the standard protocol following bolus administration of intravenous contrast. RADIATION DOSE REDUCTION: This exam was performed according to the departmental dose-optimization program which includes automated exposure control, adjustment of the mA and/or kV according to patient size and/or use of iterative reconstruction technique. CONTRAST:  OMNIPAQUE IOHEXOL 300 MG/ML  SOLN COMPARISON:  04/29/2014 FINDINGS: Lower chest: Small right pleural effusion. Bibasilar airspace opacities, favor atelectasis. Coronary artery and aortic atherosclerosis. Hepatobiliary: Innumerable low-density lesions throughout the liver most compatible with metastases. Gallbladder is distended with surrounding pericholecystic fluid. Central common bile duct dilated  measuring 11 mm, tapers in the pancreatic head. No visible ductal stones. Pancreas: Dilated pancreatic duct measuring up to 5 mm. Irregular solid-appearing low-density mass in the pancreatic head measures 2.2 x 1.8 cm. This is concerning for pancreatic cancer. Spleen: No focal abnormality.  Normal size. Adrenals/Urinary Tract: Severe right hydronephrosis. Right ureter appears decompressed and difficult to follow. Configuration suggests UPJ obstruction. Multiple bilateral renal cysts which appear benign. No hydronephrosis on the left. Adrenal glands and urinary bladder unremarkable. Stomach/Bowel: Normal appendix. Sigmoid diverticulosis. No active diverticulitis. Stomach and small bowel decompressed, unremarkable. Vascular/Lymphatic: Aortic atherosclerosis. No evidence of aneurysm or adenopathy. Reproductive: Prior hysterectomy.  No adnexal masses. Other: Moderate free fluid in the pelvis and adjacent to the liver and spleen. No free air. Musculoskeletal: No acute bony abnormality or suspicious focal abnormality. Degenerative disc and facet disease throughout the lumbar spine. Grade 1 anterolisthesis at L4-5 related to facet disease. IMPRESSION: 2.2 x 1.8 cm low-density mass in the pancreatic head concerning for pancreatic cancer. Associated biliary and pancreatic ductal dilatation and gallbladder distension. Innumerable low-density lesions throughout the liver compatible with metastases. Right hydronephrosis. Right ureter is decompressed suggesting UPJ obstruction. This is new since 2015. Sigmoid diverticulosis.  No active diverticulitis. Aortic atherosclerosis. Small right pleural effusion.  Bibasilar atelectasis. Moderate free fluid in the abdomen and pelvis. Electronically Signed   By: Charlett Nose M.D.   On: 10/11/2023 19:47   DG Chest Port 1 View Result Date: 10/11/2023 CLINICAL DATA:  Weakness EXAM: PORTABLE CHEST 1 VIEW COMPARISON:  X-ray 06/14/2022 and older. FINDINGS: Hyperinflation. No consolidation,  pneumothorax or effusion. There is some linear opacity in the right lung base likely scar or atelectasis. Normal cardiopericardial silhouette. Calcified aorta. Calcifications in the area of the mitral valve annulus. Degenerative changes of the spine and shoulders. Elevated humeral head bilaterally, possible full-thickness rotator cuff tears. Overlapping cardiac leads. IMPRESSION: Right lung base scar or atelectasis.  No consolidation. Rotated radiograph. Electronically Signed   By: Karen Kays M.D.   On: 10/11/2023 17:06    EKG: Personally reviewed by me which shows normal sinus rhythm   Consultant: None yet  Code Status: DNR.  Confirmed with the patient's daughter  at bedside  Microbiology none  Antibiotics: None  Family Communication:  Patients' condition and plan of care including tests being ordered have been discussed with the patient and patient's daughters at bedside who indicate understanding and agree with the plan.   Status is: Inpatient  Severity of Illness: The appropriate patient status for this patient is INPATIENT. Inpatient status is judged to be reasonable and necessary in order to provide the required intensity of service to ensure the patient's safety. The patient's presenting symptoms, physical exam findings, and initial radiographic and laboratory data in the context of their chronic comorbidities is felt to place them at high risk for further clinical deterioration. Furthermore, it is not anticipated that the patient will be medically stable for discharge from the hospital within 2 midnights of admission.   * I certify that at the point of admission it is my clinical judgment that the patient will require inpatient hospital care spanning beyond 2 midnights from the point of admission due to high intensity of service, high risk for further deterioration and high frequency of surveillance required.*  Signed, Joycelyn Das, MD [facility] 10/11/2023

## 2023-10-11 NOTE — Hospital Course (Signed)
Patient is 89 years old female with past medical history of hepatitis C, congestive heart failure, hyperlipidemia, hypertension, history of chronic left hip pain and fall recently presented to hospital with generalized weakness, fatigue, decreased oral intake and lethargy brought in by the family mostly since Saturday.  Patient initially had some vomiting but has not vomited since Saturday.  In the ED, patient's vitals were within normal range.  Initial labs showed a lactic acid elevated at 2.3 followed by 1.9.  CBC showed hemoglobin low at 13.6 with no leukocytosis.  Blood cultures were sent from the ED.  COVID influenza and RSV was negative.  BMP notable for sodium low at 130 and potassium of 3.1.  Albumin is low at 2.8.  Bilirubin was elevated at 4.5.  Alkaline phosphatase 150.  Chest x-ray done in the ED showed right lung base scar atelectasis.  CT head scan was negative for acute findings.  CT scan of the abdomen pelvis with contrast showed 2.2 x 1.8 cm low-density mass in the pancreatic head concerning for pancreatic cancer with associated biliary and pancreatic duct dilatation and gallbladder distention.  Multiple low-density lesions were noted in the liver compatible with metastasis.  Patient also had right hydronephrosis with UPJ obstruction.  In the ED, patient received IV fluids, potassium and was considered for admission to the hospital.    Hyponatremia.  Appeared to be mildly volume depleted.  Will continue with normal saline.  Check BMP in AM.  Volume depletion.  Lactate has improved with hydration.  Continue with IV fluids.  Hypokalemia.  Potassium of 3.1 on presentation.  Will replenish.  Check levels in AM. . History of hepatitis C Seen by GI group at [facility] in the past.  Had an MRI done 2019, had seen Dr. Bosie Clos in the past for EGD.Marland Kitchen  Ammonia level was 18.  Check CMP in AM.  Pancreatic mass with obstructive jaundice. CT abdomen pelvis with 2 x 2 pancreatic mass metastasis in the  liver, lipase of 340.Tenderness over the epigastric region with some icterus.  Bilirubin up to 4.5.  Will get direct bilirubin, MRI of the abdomen and pelvis  Left hip pain from osteoarthritis.   Multiple falls in the past. Will get PT OT evaluation.  Decreased p.o. intake, generalized weakness. Will get PT OT evaluation.  History of congestive heart failure.  Currently volume depleted.  Continue IV fluids.  Hold off with diuretics for now.  Hyperlipidemia  Hypertension

## 2023-10-11 NOTE — ED Notes (Signed)
RN has called report, RN Renita  made aware that pt will be coming to room, all questions answered, RN also made aware that pt potassium will be paused and disconnected r/t tech taking pt upstairs. Pt going by stretcher.

## 2023-10-11 NOTE — ED Triage Notes (Signed)
Pt coming from home. Since Saturday pt has decreased PO intake, and vomiting. Pt now complaining of generalized weakness, and lethargy. No episodes of emesis since Saturday. Pt has left hip pain from a recent fracture, but is already being seen for and has no new complaints related to it.

## 2023-10-11 NOTE — ED Notes (Signed)
..ED TO INPATIENT HANDOFF REPORT  Name/Age/Gender Caroline Ortiz 89 y.o. female  Code Status    Code Status Orders  (From admission, onward)           Start     Ordered   10/11/23 2054  Do not attempt resuscitation (DNR)- Limited -Do Not Intubate (DNI)  Continuous       Question Answer Comment  If pulseless and not breathing No CPR or chest compressions.   In Pre-Arrest Conditions (Patient Is Breathing and Has A Pulse) Do not intubate. Provide all appropriate non-invasive medical interventions. Avoid ICU transfer unless indicated or required.   Consent: Discussion documented in EHR or advanced directives reviewed      10/11/23 2055           Code Status History     This patient has a current code status but no historical code status.       Home/SNF/Other Home  Chief Complaint Generalized weakness [R53.1] Pancreatic mass [K86.89]  Level of Care/Admitting Diagnosis ED Disposition     ED Disposition  Admit   Condition  --   Comment  Hospital Area: [facility] [[id number]]  Level of Care: Telemetry [5]  Admit to tele based on following criteria: Monitor QTC interval  May admit patient to [facility] or [facility] if equivalent level of care is available:: Yes  Covid Evaluation: Asymptomatic - no recent exposure (last 10 days) testing not required  Diagnosis: Pancreatic mass [320391]  Admitting Physician: Joycelyn Das [[id number]]  Attending Physician: Joycelyn Das [[id number]]  Certification:: I certify this patient will need inpatient services for at least 2 midnights  Expected Medical Readiness: 10/13/2023          Medical History Past Medical History:  Diagnosis Date   CHF (congestive heart failure) (HCC)    Hep C w/ coma, chronic    High cholesterol    Hypertension     Allergies Allergies  Allergen Reactions   Penicillins Hives, Swelling, Rash and Other (See Comments)    DID THE REACTION INVOLVE: Swelling of the  face/tongue/throat, SOB, or low BP? Yes, Sudden or severe rash/hives, skin peeling, or the inside of the mouth or nose? no, Did it require medical treatment? No, When did it last happen?    younger  , If all above answers are "NO", may proceed with cephalosporin use.    IV Location/Drains/Wounds Patient Lines/Drains/Airways Status     Active Line/Drains/Airways     Name Placement date Placement time Site Days   Peripheral IV 10/11/23 20 G 1" Anterior;Left;Proximal Forearm 10/11/23  1537  Forearm  less than 1   Urethral Catheter Jill Side., RN Latex 14 Fr. 08/20/23  2022  Latex  52            Labs/Imaging Results for orders placed or performed during the hospital encounter of 10/11/23 (from the past 48 hours)  CBG monitoring, ED     Status: None   Collection Time: 10/11/23  4:08 PM  Result Value Ref Range   Glucose-Capillary 89 70 - 99 mg/dL    Comment: Glucose reference range applies only to samples taken after fasting for at least 8 hours.  CBC with Differential     Status: Abnormal   Collection Time: 10/11/23  4:36 PM  Result Value Ref Range   WBC 8.0 4.0 - 10.5 K/uL   RBC 3.29 (L) 3.87 - 5.11 MIL/uL   Hemoglobin 10.6 (L) 12.0 - 15.0 g/dL   HCT  31.7 (L) 36.0 - 46.0 %   MCV 96.4 80.0 - 100.0 fL   MCH 32.2 26.0 - 34.0 pg   MCHC 33.4 30.0 - 36.0 g/dL   RDW 44.0 10.2 - 72.5 %   Platelets 185 150 - 400 K/uL   nRBC 0.0 0.0 - 0.2 %   Neutrophils Relative % 82 %   Neutro Abs 6.5 1.7 - 7.7 K/uL   Lymphocytes Relative 8 %   Lymphs Abs 0.7 0.7 - 4.0 K/uL   Monocytes Relative 9 %   Monocytes Absolute 0.7 0.1 - 1.0 K/uL   Eosinophils Relative 0 %   Eosinophils Absolute 0.0 0.0 - 0.5 K/uL   Basophils Relative 0 %   Basophils Absolute 0.0 0.0 - 0.1 K/uL   Immature Granulocytes 1 %   Abs Immature Granulocytes 0.04 0.00 - 0.07 K/uL    Comment: Performed at [facility], [address] [place], [place] [address]  Lipase, blood     Status: Abnormal   Collection  Time: 10/11/23  4:36 PM  Result Value Ref Range   Lipase 340 (H) 11 - 51 U/L    Comment: Performed at [facility], [address], [place], [place] [address]  Comprehensive metabolic panel     Status: Abnormal   Collection Time: 10/11/23  4:36 PM  Result Value Ref Range   Sodium 130 (L) 135 - 145 mmol/L   Potassium 3.1 (L) 3.5 - 5.1 mmol/L   Chloride 93 (L) 98 - 111 mmol/L   CO2 25 22 - 32 mmol/L   Glucose, Bld 106 (H) 70 - 99 mg/dL    Comment: Glucose reference range applies only to samples taken after fasting for at least 8 hours.   BUN 9 8 - 23 mg/dL   Creatinine, Ser 2.59 0.44 - 1.00 mg/dL   Calcium 8.7 (L) 8.9 - 10.3 mg/dL   Total Protein 6.8 6.5 - 8.1 g/dL   Albumin 2.8 (L) 3.5 - 5.0 g/dL   AST 563 (H) 15 - 41 U/L   ALT 34 0 - 44 U/L   Alkaline Phosphatase 150 (H) 38 - 126 U/L   Total Bilirubin 4.5 (H) 0.0 - 1.2 mg/dL   GFR, Estimated >87 >56 mL/min    Comment: (NOTE) Calculated using the CKD-EPI Creatinine Equation (2021)    Anion gap 12 5 - 15    Comment: Performed at [facility], [address] [place], [place] [address]  Culture, blood (routine x 2)     Status: None (Preliminary result)   Collection Time: 10/11/23  4:36 PM   Specimen: BLOOD  Result Value Ref Range   Specimen Description      BLOOD LEFT ANTECUBITAL Performed at [facility], [address] [place], [place] [address]    Special Requests      BOTTLES DRAWN AEROBIC AND ANAEROBIC Blood Culture adequate volume Performed at [facility], [address] [place], [place] [address]    Culture PENDING    Report Status PENDING   Ammonia     Status: None   Collection Time: 10/11/23  4:36 PM  Result Value Ref Range   Ammonia 18 9 - 35 umol/L    Comment: Performed at [facility], [address] [place], [place] [address]  Resp panel by RT-PCR (RSV, Flu A&B, Covid) Anterior Nasal Swab     Status: None   Collection Time:  10/11/23  4:42 PM  Specimen: Anterior Nasal Swab  Result Value Ref Range   SARS Coronavirus 2 by RT PCR NEGATIVE NEGATIVE    Comment: (NOTE) SARS-CoV-2 target nucleic acids are NOT DETECTED.  The SARS-CoV-2 RNA is generally detectable in upper respiratory specimens during the acute phase of infection. The lowest concentration of SARS-CoV-2 viral copies this assay can detect is 138 copies/mL. A negative result does not preclude SARS-Cov-2 infection and should not be used as the sole basis for treatment or other patient management decisions. A negative result may occur with  improper specimen collection/handling, submission of specimen other than nasopharyngeal swab, presence of viral mutation(s) within the areas targeted by this assay, and inadequate number of viral copies(<138 copies/mL). A negative result must be combined with clinical observations, patient history, and epidemiological information. The expected result is Negative.  Fact Sheet for Patients:  BloggerCourse.com  Fact Sheet for Healthcare Providers:  SeriousBroker.it  This test is no t yet approved or cleared by the [place] FDA and  has been authorized for detection and/or diagnosis of SARS-CoV-2 by FDA under an Emergency Use Authorization (EUA). This EUA will remain  in effect (meaning this test can be used) for the duration of the COVID-19 declaration under Section 564(b)(1) of the Act, 21 U.S.C.section 360bbb-3(b)(1), unless the authorization is terminated  or revoked sooner.       Influenza A by PCR NEGATIVE NEGATIVE   Influenza B by PCR NEGATIVE NEGATIVE    Comment: (NOTE) The Xpert Xpress SARS-CoV-2/FLU/RSV plus assay is intended as an aid in the diagnosis of influenza from Nasopharyngeal swab specimens and should not be used as a sole basis for treatment. Nasal washings and aspirates are unacceptable for Xpert Xpress  SARS-CoV-2/FLU/RSV testing.  Fact Sheet for Patients: BloggerCourse.com  Fact Sheet for Healthcare Providers: SeriousBroker.it  This test is not yet approved or cleared by the [place] FDA and has been authorized for detection and/or diagnosis of SARS-CoV-2 by FDA under an Emergency Use Authorization (EUA). This EUA will remain in effect (meaning this test can be used) for the duration of the COVID-19 declaration under Section 564(b)(1) of the Act, 21 U.S.C. section 360bbb-3(b)(1), unless the authorization is terminated or revoked.     Resp Syncytial Virus by PCR NEGATIVE NEGATIVE    Comment: (NOTE) Fact Sheet for Patients: BloggerCourse.com  Fact Sheet for Healthcare Providers: SeriousBroker.it  This test is not yet approved or cleared by the [place] FDA and has been authorized for detection and/or diagnosis of SARS-CoV-2 by FDA under an Emergency Use Authorization (EUA). This EUA will remain in effect (meaning this test can be used) for the duration of the COVID-19 declaration under Section 564(b)(1) of the Act, 21 U.S.C. section 360bbb-3(b)(1), unless the authorization is terminated or revoked.  Performed at [facility], [address], [place], [place] [address]   I-Stat Lactic Acid     Status: Abnormal   Collection Time: 10/11/23  4:50 PM  Result Value Ref Range   Lactic Acid, Venous 2.3 (HH) 0.5 - 1.9 mmol/L   Comment NOTIFIED PHYSICIAN   I-Stat Lactic Acid     Status: None   Collection Time: 10/11/23  8:16 PM  Result Value Ref Range   Lactic Acid, Venous 1.9 0.5 - 1.9 mmol/L   CT Head Wo Contrast Result Date: 10/11/2023 CLINICAL DATA:  Altered mental status EXAM: CT HEAD WITHOUT CONTRAST TECHNIQUE: Contiguous axial images were obtained from the base of the skull through the vertex without intravenous contrast. RADIATION DOSE  REDUCTION:  This exam was performed according to the departmental dose-optimization program which includes automated exposure control, adjustment of the mA and/or kV according to patient size and/or use of iterative reconstruction technique. COMPARISON:  04/18/2016 FINDINGS: Brain: No acute intracranial abnormality. Specifically, no hemorrhage, hydrocephalus, mass lesion, acute infarction, or significant intracranial injury. Vascular: No hyperdense vessel or unexpected calcification. Skull: No acute calvarial abnormality. Sinuses/Orbits: No acute findings Other: None IMPRESSION: No acute intracranial abnormality. Electronically Signed   By: Charlett Nose M.D.   On: 10/11/2023 19:48   CT ABDOMEN PELVIS W CONTRAST Result Date: 10/11/2023 CLINICAL DATA:  Abdominal pain, vomiting EXAM: CT ABDOMEN AND PELVIS WITH CONTRAST TECHNIQUE: Multidetector CT imaging of the abdomen and pelvis was performed using the standard protocol following bolus administration of intravenous contrast. RADIATION DOSE REDUCTION: This exam was performed according to the departmental dose-optimization program which includes automated exposure control, adjustment of the mA and/or kV according to patient size and/or use of iterative reconstruction technique. CONTRAST:  OMNIPAQUE IOHEXOL 300 MG/ML  SOLN COMPARISON:  04/29/2014 FINDINGS: Lower chest: Small right pleural effusion. Bibasilar airspace opacities, favor atelectasis. Coronary artery and aortic atherosclerosis. Hepatobiliary: Innumerable low-density lesions throughout the liver most compatible with metastases. Gallbladder is distended with surrounding pericholecystic fluid. Central common bile duct dilated measuring 11 mm, tapers in the pancreatic head. No visible ductal stones. Pancreas: Dilated pancreatic duct measuring up to 5 mm. Irregular solid-appearing low-density mass in the pancreatic head measures 2.2 x 1.8 cm. This is concerning for pancreatic cancer. Spleen: No focal abnormality.   Normal size. Adrenals/Urinary Tract: Severe right hydronephrosis. Right ureter appears decompressed and difficult to follow. Configuration suggests UPJ obstruction. Multiple bilateral renal cysts which appear benign. No hydronephrosis on the left. Adrenal glands and urinary bladder unremarkable. Stomach/Bowel: Normal appendix. Sigmoid diverticulosis. No active diverticulitis. Stomach and small bowel decompressed, unremarkable. Vascular/Lymphatic: Aortic atherosclerosis. No evidence of aneurysm or adenopathy. Reproductive: Prior hysterectomy.  No adnexal masses. Other: Moderate free fluid in the pelvis and adjacent to the liver and spleen. No free air. Musculoskeletal: No acute bony abnormality or suspicious focal abnormality. Degenerative disc and facet disease throughout the lumbar spine. Grade 1 anterolisthesis at L4-5 related to facet disease. IMPRESSION: 2.2 x 1.8 cm low-density mass in the pancreatic head concerning for pancreatic cancer. Associated biliary and pancreatic ductal dilatation and gallbladder distension. Innumerable low-density lesions throughout the liver compatible with metastases. Right hydronephrosis. Right ureter is decompressed suggesting UPJ obstruction. This is new since 2015. Sigmoid diverticulosis.  No active diverticulitis. Aortic atherosclerosis. Small right pleural effusion.  Bibasilar atelectasis. Moderate free fluid in the abdomen and pelvis. Electronically Signed   By: Charlett Nose M.D.   On: 10/11/2023 19:47   DG Chest Port 1 View Result Date: 10/11/2023 CLINICAL DATA:  Weakness EXAM: PORTABLE CHEST 1 VIEW COMPARISON:  X-ray 06/14/2022 and older. FINDINGS: Hyperinflation. No consolidation, pneumothorax or effusion. There is some linear opacity in the right lung base likely scar or atelectasis. Normal cardiopericardial silhouette. Calcified aorta. Calcifications in the area of the mitral valve annulus. Degenerative changes of the spine and shoulders. Elevated humeral head  bilaterally, possible full-thickness rotator cuff tears. Overlapping cardiac leads. IMPRESSION: Right lung base scar or atelectasis.  No consolidation. Rotated radiograph. Electronically Signed   By: Karen Kays M.D.   On: 10/11/2023 17:06    Pending Labs Unresulted Labs (From admission, onward)     Start     Ordered   10/12/23 0500  Comprehensive metabolic panel  Tomorrow morning,  R        10/11/23 2055   10/12/23 0500  CBC  Tomorrow morning,   R        10/11/23 2055   10/12/23 0500  Protime-INR  Tomorrow morning,   R        10/11/23 2055   10/12/23 0500  Hemoglobin A1c  Tomorrow morning,   R        10/11/23 2055   10/12/23 0500  Magnesium  Tomorrow morning,   R        10/11/23 2055   10/12/23 0500  Phosphorus  Tomorrow morning,   R        10/11/23 2055   10/11/23 1602  Culture, blood (routine x 2)  BLOOD CULTURE X 2,   R (with STAT occurrences)      10/11/23 1601   10/11/23 1601  Urinalysis, w/ Reflex to Culture (Infection Suspected) -Urine, Clean Catch  Once,   URGENT       Question:  Specimen Source  Answer:  Urine, Clean Catch   10/11/23 1601            Vitals/Pain Today's Vitals   10/11/23 1730 10/11/23 1800 10/11/23 1830 10/11/23 1932  BP: (!) 141/82 133/83 139/77   Pulse: 76 67 68 77  Resp: (!) 22 18 (!) 23 18  Temp:    97.9 F (36.6 C)  TempSrc:    Oral  SpO2: 100% 100% 100% 100%  Height:      PainSc:        Isolation Precautions No active isolations  Medications Medications  potassium chloride 10 mEq in 100 mL IVPB (10 mEq Intravenous New Bag/Given 10/11/23 2056)  amLODipine (NORVASC) tablet 2.5 mg (has no administration in time range)  metoprolol tartrate (LOPRESSOR) tablet 50 mg (has no administration in time range)  ALPRAZolam (XANAX) tablet 0.25 mg (has no administration in time range)  gabapentin (NEURONTIN) capsule 300 mg (has no administration in time range)  methocarbamol (ROBAXIN) tablet 500 mg (has no administration in time range)   enoxaparin (LOVENOX) injection 40 mg (has no administration in time range)  sodium chloride flush (NS) 0.9 % injection 3 mL (has no administration in time range)  sodium chloride flush (NS) 0.9 % injection 3 mL (has no administration in time range)  0.9 %  sodium chloride infusion (has no administration in time range)  acetaminophen (TYLENOL) tablet 650 mg (has no administration in time range)    Or  acetaminophen (TYLENOL) suppository 650 mg (has no administration in time range)  HYDROcodone-acetaminophen (NORCO/VICODIN) 5-325 MG per tablet 1-2 tablet (has no administration in time range)  polyethylene glycol (MIRALAX / GLYCOLAX) packet 17 g (has no administration in time range)  ondansetron (ZOFRAN) tablet 4 mg (has no administration in time range)    Or  ondansetron (ZOFRAN) injection 4 mg (has no administration in time range)  hydrALAZINE (APRESOLINE) injection 5 mg (has no administration in time range)  morphine (PF) 2 MG/ML injection 2 mg (has no administration in time range)  docusate sodium (COLACE) capsule 100 mg (has no administration in time range)  dextrose 5 % and 0.9% NaCl 1,000 mL with potassium chloride 20 mEq/L Pediatric IV infusion (has no administration in time range)  sodium chloride 0.9 % bolus 500 mL (0 mLs Intravenous Stopped 10/11/23 1950)  iohexol (OMNIPAQUE) 300 MG/ML solution 100 mL (100 mLs Intravenous Contrast Given 10/11/23 1914)  0.9 %  sodium chloride infusion ( Intravenous New Bag/Given 10/11/23 2055)  Mobility walks with device

## 2023-10-11 NOTE — ED Notes (Signed)
CRITICAL VALUE STICKER  CRITICAL VALUE: Lactic 2.3  RECEIVER (on-site recipient of call):  DATE & TIME NOTIFIED: 1700, 10/11/2023  MESSENGER (representative from lab):  MD NOTIFIED: Dr. Rodena Medin   TIME OF NOTIFICATION: 1700, 10/11/2023  RESPONSE: Orders received.

## 2023-10-12 ENCOUNTER — Inpatient Hospital Stay (HOSPITAL_COMMUNITY): Payer: Medicare Other

## 2023-10-12 DIAGNOSIS — Z7189 Other specified counseling: Secondary | ICD-10-CM

## 2023-10-12 DIAGNOSIS — R531 Weakness: Secondary | ICD-10-CM | POA: Diagnosis not present

## 2023-10-12 DIAGNOSIS — Z515 Encounter for palliative care: Secondary | ICD-10-CM

## 2023-10-12 DIAGNOSIS — Z66 Do not resuscitate: Secondary | ICD-10-CM

## 2023-10-12 LAB — URINALYSIS, W/ REFLEX TO CULTURE (INFECTION SUSPECTED)
Bacteria, UA: NONE SEEN
Bilirubin Urine: NEGATIVE
Glucose, UA: NEGATIVE mg/dL
Hgb urine dipstick: NEGATIVE
Ketones, ur: NEGATIVE mg/dL
Leukocytes,Ua: NEGATIVE
Nitrite: NEGATIVE
Protein, ur: 30 mg/dL — AB
Specific Gravity, Urine: 1.026 (ref 1.005–1.030)
pH: 6 (ref 5.0–8.0)

## 2023-10-12 LAB — HEMOGLOBIN A1C
Hgb A1c MFr Bld: 5.3 % (ref 4.8–5.6)
Mean Plasma Glucose: 105.41 mg/dL

## 2023-10-12 LAB — COMPREHENSIVE METABOLIC PANEL
ALT: 29 U/L (ref 0–44)
AST: 95 U/L — ABNORMAL HIGH (ref 15–41)
Albumin: 2.4 g/dL — ABNORMAL LOW (ref 3.5–5.0)
Alkaline Phosphatase: 123 U/L (ref 38–126)
Anion gap: 7 (ref 5–15)
BUN: 9 mg/dL (ref 8–23)
CO2: 22 mmol/L (ref 22–32)
Calcium: 8.3 mg/dL — ABNORMAL LOW (ref 8.9–10.3)
Chloride: 103 mmol/L (ref 98–111)
Creatinine, Ser: 0.6 mg/dL (ref 0.44–1.00)
GFR, Estimated: >60 mL/min (ref >60)
Glucose, Bld: 116 mg/dL — ABNORMAL HIGH (ref 70–99)
Potassium: 3.3 mmol/L — ABNORMAL LOW (ref 3.5–5.1)
Sodium: 132 mmol/L — ABNORMAL LOW (ref 135–145)
Total Bilirubin: 4.1 mg/dL — ABNORMAL HIGH (ref 0.0–1.2)
Total Protein: 5.9 g/dL — ABNORMAL LOW (ref 6.5–8.1)

## 2023-10-12 LAB — CBC
HCT: 29.4 % — ABNORMAL LOW (ref 36.0–46.0)
Hemoglobin: 9.7 g/dL — ABNORMAL LOW (ref 12.0–15.0)
MCH: 32.1 pg (ref 26.0–34.0)
MCHC: 33 g/dL (ref 30.0–36.0)
MCV: 97.4 fL (ref 80.0–100.0)
Platelets: 163 10*3/uL (ref 150–400)
RBC: 3.02 MIL/uL — ABNORMAL LOW (ref 3.87–5.11)
RDW: 15.9 % — ABNORMAL HIGH (ref 11.5–15.5)
WBC: 6.4 10*3/uL (ref 4.0–10.5)
nRBC: 0 % (ref 0.0–0.2)

## 2023-10-12 LAB — BILIRUBIN, DIRECT: Bilirubin, Direct: 2.3 mg/dL — ABNORMAL HIGH (ref 0.0–0.2)

## 2023-10-12 LAB — PHOSPHORUS: Phosphorus: 2.8 mg/dL (ref 2.5–4.6)

## 2023-10-12 LAB — MAGNESIUM: Magnesium: 1.7 mg/dL (ref 1.7–2.4)

## 2023-10-12 LAB — PROTIME-INR
INR: 1.4 — ABNORMAL HIGH (ref 0.8–1.2)
Prothrombin Time: 17.3 s — ABNORMAL HIGH (ref 11.4–15.2)

## 2023-10-12 MED ORDER — GADOBUTROL 1 MMOL/ML IV SOLN
6.0000 mL | Freq: Once | INTRAVENOUS | Status: AC | PRN
  Administered 2023-10-12: 6 mL via INTRAVENOUS

## 2023-10-12 MED ORDER — POTASSIUM CHLORIDE 10 MEQ/100ML IV SOLN
INTRAVENOUS | Status: AC
  Administered 2023-10-12: 10 meq
  Administered 2023-10-12: 10 meq via INTRAVENOUS
  Filled 2023-10-12: qty 100

## 2023-10-12 MED ORDER — IPRATROPIUM-ALBUTEROL 0.5-2.5 (3) MG/3ML IN SOLN: 3.0000 mL | RESPIRATORY_TRACT | Status: DC | PRN

## 2023-10-12 MED ORDER — POTASSIUM CHLORIDE 10 MEQ/100ML IV SOLN
10.0000 meq | INTRAVENOUS | Status: AC
  Administered 2023-10-12 (×2): 10 meq via INTRAVENOUS
  Filled 2023-10-12 (×2): qty 100

## 2023-10-12 MED ORDER — SENNOSIDES-DOCUSATE SODIUM 8.6-50 MG PO TABS: 1.0000 | ORAL_TABLET | Freq: Every evening | ORAL | Status: DC | PRN

## 2023-10-12 MED ORDER — HYDRALAZINE HCL 20 MG/ML IJ SOLN: 10.0000 mg | INTRAMUSCULAR | Status: DC | PRN

## 2023-10-12 MED ORDER — METOPROLOL TARTRATE 5 MG/5ML IV SOLN
5.0000 mg | INTRAVENOUS | Status: DC | PRN
Start: 2023-10-12 — End: 2023-10-13

## 2023-10-12 NOTE — Progress Notes (Signed)
OT Cancellation Note  Patient Details Name: DIVYA NEIGHBOR MRN: [id number] DOB: 1932/06/25   Cancelled Treatment:    Reason Eval/Treat Not Completed: Patient at procedure or test/ unavailable (going down for MRI, talking with Palliative) OT will continue to follow acutely and will re-attempt eval as schedule allows.   Evern Bio Virgie Chery 10/12/2023, 10:52 AM  Nyoka Cowden OTR/L Acute Rehabilitation Services Office: [phone]

## 2023-10-12 NOTE — Progress Notes (Addendum)
PT Cancellation Note  Patient Details Name: Caroline Ortiz MRN: [id number] DOB: 02/27/1932   Cancelled Treatment:    Reason Eval/Treat Not Completed:  Reason Eval/Treat Not Completed: Patient at procedure or test/ unavailable--(going down for MRI, talking with Palliative) PT will continue to follow acutely and will re-attempt eval as schedule allows and if pt/family agreeable  Will hold PT today-family awaiting MRI results and continued consultation with MD and palliative care.    Faye Ramsay, PT Acute Rehabilitation  Office: [phone]

## 2023-10-12 NOTE — Plan of Care (Signed)
?  Problem: Education: ?Goal: Knowledge of General Education information will improve ?Description: Including pain rating scale, medication(s)/side effects and non-pharmacologic comfort measures ?Outcome: Progressing ?  ?Problem: Health Behavior/Discharge Planning: ?Goal: Ability to manage health-related needs will improve ?Outcome: Progressing ?  ?Problem: Clinical Measurements: ?Goal: Will remain free from infection ?Outcome: Progressing ?Goal: Cardiovascular complication will be avoided ?Outcome: Progressing ?  ?Problem: Activity: ?Goal: Risk for activity intolerance will decrease ?Outcome: Progressing ?  ?

## 2023-10-12 NOTE — Consult Note (Signed)
Palliative Medicine Inpatient Consult Note  Consulting Provider:  Joycelyn Das, MD   Reason for consult:   Question Answer  Palliative Care Consult Services Palliative Medicine Consult  Reason for Consult? Pancreatic mass with obstructive jaundice with liver lesions in hydronephrosis, failure to thrive, elderly female, goals of care   10/12/2023  HPI:  Per intake H&P -->  Patient is 89 years old female with past medical history of hepatitis C, congestive heart failure, hypertension, chronic left hip pain osteoarthritis and history of multiple falls was brought in by the family, with generalized weakness, fatigue, decreased oral intake and lethargy largely since Saturday.  Patient initially had some vomiting but has not vomited since Saturday.  Palliative care has been asked to get involved to support additional goals of care conversations.   Clinical Assessment/Goals of Care:  *Please note that this is a verbal dictation therefore any spelling or grammatical errors are due to the "[employer]" system interpretation.  I have reviewed medical records including EPIC notes, labs and imaging, received report from bedside RN, assessed the patient who is lying in bed sleeping soundly, pleasantly confused when she awakens.    I met with patients two daughters, Agustin Cree and Sunny Schlein  to further discuss diagnosis prognosis, GOC, EOL wishes, disposition and options.   I introduced Palliative Medicine as specialized medical care for people living with serious illness. It focuses on providing relief from the symptoms and stress of a serious illness. The goal is to improve quality of life for both the patient and the family.  Medical History Review and Understanding:  A review of Jacinta's past medical history significant for hepatitis C, congestive heart failure, left hip pain in the setting of osteoarthritis, falls, hypertension, and recent adult failure to thrive was held.  Social  History:  Sicily lives in [place].  She has been married to her husband for the past 65 years.  She has 5 daughters, 14 grandchildren, and multiple great-grandchildren.  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  She is a woman who loves her family and making Sunday dinners.  Lorriann is described as someone with a green thumb and enjoys flowers and gardening thoroughly.  Functional and Nutritional State:  Preceding hospitalization, Avnoor was living in a single-family home with her husband and daughter Cira Rue.  Per her daughter she has a small home and was able to get around inside it with a walker.  When outside the home they did utilize a wheelchair in the setting of her progressive weakness.  Patient's daughters help her with bathing and note that sometimes Gianne could dress herself well other times she was unable to do so therefore they would assist her.  Bellatrix's appetite has been dwindling since November of this year  Advance Directives:  A detailed discussion was had today regarding advanced directives.  Patient's husband and 5 daughters make decisions based upon patient's best interest.  Code Status:  Concepts specific to code status, artifical feeding and hydration, continued IV antibiotics and rehospitalization was had.  The difference between a aggressive medical intervention path  and a palliative comfort care path for this patient at this time was had.   Skyela is an established DO NOT RESUSCITATE DO NOT INTUBATE CODE STATUS which was confirmed with patient's children.  Discussion:  We discussed patient's generalized weakness and adult failure to thrive since Thanksgiving of this past year (2024).  We reviewed that she has been less and less  able to provide care for herself and is predominantly requiring assistance with mobility and being in the wheelchair.  This is in the setting of her current health but also due to severe arthritic  left hip pain which is caused her to be predominantly immobilized.   We reviewed the concerns associated with findings on recent CT scan inclusive of pancreatic masses  and liver lesions.  We discussed that this is suggestive of potential pancreatic cancer.  We reviewed that her symptoms would correlate with this.  We discussed options from here inclusive of potential biopsy, chemotherapy, and radiation based upon what the biopsy would show.  Patient's daughters do not believe she would want a biopsy given that she was not strong enough to endure her left hip surgery.   We reviewed that patient is due to get an MRI today which will hopefully be more clarifying regarding these masses and lesions.  After this is resulted patient's family plan to sit and discuss among themselves the options moving forward.  I was able to clarify the differences between palliative and hospice support as below:  Palliative care is specialized medical care for people living with a serious illness, such as cancer, heart failure, COPD, Alzheimer's dementia, etc. Patients in palliative care may receive medical care for their symptoms, or palliative care, along with treatment intended to cure their serious illness   Hospice care focuses on the care, comfort, and quality of life of a person with a serious illness who is approaching the end of life. At some point, it may not be possible to cure a serious illness, or a patient may choose not to undergo certain treatments. Hospice is designed for this situation.  I shared if the family determines they do not want to be aggressive with management which in all honesty looking at [place] I do not think she could tolerate then hospice would be a very reasonable consideration.  Plan to follow-up in the oncoming days regarding family decision.  Discussed the importance of continued conversation with family and their  medical providers regarding overall plan of care and treatment  options, ensuring decisions are within the context of the patients values and GOCs.  Decision Maker: Armseur,Darlene (Daughter): [phone] (Mobile)   SUMMARY OF RECOMMENDATIONS   DNAR/DNI  Open and honest conversations held in the setting of patient's acute on chronic disease  Discussed the differences between palliative care and hospice care  Patients family plan to speak among themselves after MRI results return for further decisions  Ongoing palliative care support  Code Status/Advance Care Planning: DNAR/DNI  Palliative Prophylaxis:  Aspiration, Bowel Regimen, Delirium Protocol, Frequent Pain Assessment, Oral Care, Palliative Wound Care, and Turn Reposition  Additional Recommendations (Limitations, Scope, Preferences): Continue current care  Psycho-social/Spiritual:  Desire for further Chaplaincy support: Yes Additional Recommendations: Education on hospice support in the setting of malignancy   Prognosis: Limited overall.   Discharge Planning: Unclear at this time.   Vitals:   10/12/23 0600 10/12/23 0940  BP: 133/86 (!) 142/75  Pulse: 75 80  Resp: 16 16  Temp: 97.6 F (36.4 C) 97.6 F (36.4 C)  SpO2: 97% 100%    Intake/Output Summary (Last 24 hours) at 10/12/2023 6962 Last data filed at 10/12/2023 9528 Gross per 24 hour  Intake 351.19 ml  Output 1000 ml  Net -648.81 ml   Gen:  Elderly AA F - Frail and chronically ill appearing HEENT: Dry mucous membranes CV: Regular rate and rhythm  PULM: On RA, breathing is  even and nonlabored ABD: soft/nontender  EXT: Muscle wasting Neuro: Alert and oriented to self  PPS: 10-20%   This conversation/these recommendations were discussed with patient primary care team, Dr. Nelson Chimes  Billing based on MDM: High  Problems Addressed: One acute or chronic illness or injury that poses a threat to life or bodily function  Amount and/or Complexity of Data: Category 3:Discussion of management or test interpretation with  external physician/other qualified health care professional/appropriate source (not separately reported)  Risks: Decision regarding hospitalization or escalation of hospital care and Decision not to resuscitate or to de-escalate care because of poor prognosis ______________________________________________________ Lamarr Lulas [facility] Palliative Medicine Team Team Cell Phone: [phone] Please utilize secure chat with additional questions, if there is no response within 30 minutes please call the above phone number  Palliative Medicine Team providers are available by phone from 7am to 7pm daily and can be reached through the team cell phone.  Should this patient require assistance outside of these hours, please call the patient's attending physician.

## 2023-10-12 NOTE — Progress Notes (Addendum)
PROGRESS NOTE    Caroline Ortiz  HQI:[id number] DOB: 12-01-1931 DOA: 10/11/2023 PCP: Garlan Fillers, MD    Brief Narrative:  89 year old with history of hepatitis C, CHF, HTN, chronic hip pain, osteoarthritis, multiple falls comes to the hospital for generalized failure to thrive, poor oral intake and weakness.  Upon admission CT scan of abdomen pelvis showed concerns of pancreatic head mass possibly consistent with pancreatic cancer.  Family is currently living towards hospice/comfort care but would like to get MRI of abdomen pelvis for further discussion with the family.   Assessment & Plan:  Principal Problem:   Generalized weakness Active Problems:   Essential hypertension   Primary osteoarthritis of left hip   Pancreatic mass   Failure to thrive in adult   Obstructive jaundice     Pancreatic mass with obstructive jaundice. Right-sided hydronephrosis. Elevated total bilirubin as expected.  CT abdomen pelvis shows pancreatic mass with likely metastases.  MRI of the abdomen has been ordered.  Family would like to discuss results so they can further discuss goals of care.  At this time leaning towards hospice if indeed this is pancreatic malignancy   ADDENDUM 3PM: MRI positive for likely primary pancreatic adeno with meds, right sided Pleural effusion and right sided obs hydro.  Daughters updated. Interested in Home with hospice. TOC and Palliative notified.    Hyponatremia/hypokalemia Dehydration Due to dehydration getting IV fluids Replete electrolytes   . History of hepatitis C  Overall stable.   Left hip pain from osteoarthritis.   Multiple falls in the past. PT/OT   Decreased p.o. intake, generalized weakness, failure to thrive, significant weight loss.. Nutrition consult   History of congestive heart failure.  Preserved EF 70% Gentle fluids   Hypertension Norvasc, metoprolol.  IV as needed    DVT prophylaxis: enoxaparin (LOVENOX) injection 40 mg  Start: 10/11/23 2200    Code Status: Limited: Do not attempt resuscitation (DNR) -DNR-LIMITED -Do Not Intubate/DNI  Family Communication: Met with family at bedside Status is: Inpatient Remains inpatient appropriate because: MRI results pending, electrolyte repletion.    Subjective:  Seen at bedside, does not have any complaints.  Overall poor oral intake.  Examination:  General exam: Appears calm and comfortable, cachectic frail with bilateral temporal wasting. Respiratory system: Clear to auscultation. Respiratory effort normal. Cardiovascular system: S1 & S2 heard, RRR. No JVD, murmurs, rubs, gallops or clicks. No pedal edema. Gastrointestinal system: Abdomen is nondistended, soft and nontender. No organomegaly or masses felt. Normal bowel sounds heard. Central nervous system: Alert and oriented. No focal neurological deficits. Extremities: Symmetric 5 x 5 power. Skin: No rashes, lesions or ulcers Psychiatry: Judgement and insight appear normal. Mood & affect appropriate.                Diet Orders (From admission, onward)     Start     Ordered   10/12/23 1125  Diet regular Room service appropriate? Yes; Fluid consistency: Thin  Diet effective now       Question Answer Comment  Room service appropriate? Yes   Fluid consistency: Thin      10/12/23 1124            Objective: Vitals:   10/12/23 0153 10/12/23 0600 10/12/23 0940 10/12/23 1411  BP: 116/68 133/86 (!) 142/75 (!) 158/82  Pulse: 71 75 80 79  Resp: 12 16 16 16   Temp: 98.8 F (37.1 C) 97.6 F (36.4 C) 97.6 F (36.4 C) 98 F (36.7 C)  TempSrc: Oral  Oral Oral Oral  SpO2: 97% 97% 100% 100%  Height:        Intake/Output Summary (Last 24 hours) at 10/12/2023 1501 Last data filed at 10/12/2023 1191 Gross per 24 hour  Intake 351.19 ml  Output 1000 ml  Net -648.81 ml   There were no vitals filed for this visit.  Scheduled Meds:  amLODipine  2.5 mg Oral Daily   docusate sodium  100 mg Oral  BID   enoxaparin (LOVENOX) injection  40 mg Subcutaneous Q24H   gabapentin  300 mg Oral TID   metoprolol tartrate  50 mg Oral Daily   sodium chloride flush  3 mL Intravenous Q12H   Continuous Infusions:  sodium chloride     dextrose 5 % and 0.9 % NaCl with KCl 20 mEq/L 100 mL/hr at 10/12/23 0959    Nutritional status     Body mass index is 21.63 kg/m.  Data Reviewed:   CBC: Recent Labs  Lab 10/11/23 1636 10/12/23 0537  WBC 8.0 6.4  NEUTROABS 6.5  --   HGB 10.6* 9.7*  HCT 31.7* 29.4*  MCV 96.4 97.4  PLT 185 163   Basic Metabolic Panel: Recent Labs  Lab 10/11/23 1636 10/12/23 0537  NA 130* 132*  K 3.1* 3.3*  CL 93* 103  CO2 25 22  GLUCOSE 106* 116*  BUN 9 9  CREATININE 0.67 0.60  CALCIUM 8.7* 8.3*  MG  --  1.7  PHOS  --  2.8   GFR: CrCl cannot be calculated (Unknown ideal weight.). Liver Function Tests: Recent Labs  Lab 10/11/23 1636 10/12/23 0537  AST 115* 95*  ALT 34 29  ALKPHOS 150* 123  BILITOT 4.5* 4.1*  PROT 6.8 5.9*  ALBUMIN 2.8* 2.4*   Recent Labs  Lab 10/11/23 1636  LIPASE 340*   Recent Labs  Lab 10/11/23 1636  AMMONIA 18   Coagulation Profile: Recent Labs  Lab 10/12/23 0537  INR 1.4*   Cardiac Enzymes: No results for input(s): "CKTOTAL", "CKMB", "CKMBINDEX", "TROPONINI" in the last 168 hours. BNP (last 3 results) No results for input(s): "PROBNP" in the last 8760 hours. HbA1C: Recent Labs    10/12/23 0537  HGBA1C 5.3   CBG: Recent Labs  Lab 10/11/23 1608  GLUCAP 89   Lipid Profile: No results for input(s): "CHOL", "HDL", "LDLCALC", "TRIG", "CHOLHDL", "LDLDIRECT" in the last 72 hours. Thyroid Function Tests: No results for input(s): "TSH", "T4TOTAL", "FREET4", "T3FREE", "THYROIDAB" in the last 72 hours. Anemia Panel: No results for input(s): "VITAMINB12", "FOLATE", "FERRITIN", "TIBC", "IRON", "RETICCTPCT" in the last 72 hours. Sepsis Labs: Recent Labs  Lab 10/11/23 1650 10/11/23 2016  LATICACIDVEN 2.3* 1.9     Recent Results (from the past 240 hours)  Culture, blood (routine x 2)     Status: None (Preliminary result)   Collection Time: 10/11/23  4:36 PM   Specimen: BLOOD  Result Value Ref Range Status   Specimen Description   Final    BLOOD RIGHT ANTECUBITAL Performed at [facility], [address] [place], [place] [address]    Special Requests   Final    BOTTLES DRAWN AEROBIC AND ANAEROBIC Blood Culture results may not be optimal due to an inadequate volume of blood received in culture bottles Performed at [facility], [address], [place], [place] [address]    Culture   Final    NO GROWTH < 24 HOURS Performed at [facility], [address], [place], [place] [address]  Report Status PENDING  Incomplete  Culture, blood (routine x 2)     Status: None (Preliminary result)   Collection Time: 10/11/23  4:36 PM   Specimen: BLOOD  Result Value Ref Range Status   Specimen Description   Final    BLOOD LEFT ANTECUBITAL Performed at [facility], [address], [place], [place] [address]    Special Requests   Final    BOTTLES DRAWN AEROBIC AND ANAEROBIC Blood Culture adequate volume   Culture   Final    NO GROWTH < 24 HOURS Performed at [facility], [address] [place], [place] [address]    Report Status PENDING  Incomplete  Resp panel by RT-PCR (RSV, Flu A&B, Covid) Anterior Nasal Swab     Status: None   Collection Time: 10/11/23  4:42 PM   Specimen: Anterior Nasal Swab  Result Value Ref Range Status   SARS Coronavirus 2 by RT PCR NEGATIVE NEGATIVE Final    Comment: (NOTE) SARS-CoV-2 target nucleic acids are NOT DETECTED.  The SARS-CoV-2 RNA is generally detectable in upper respiratory specimens during the acute phase of infection. The lowest concentration of SARS-CoV-2 viral copies this assay can detect is 138 copies/mL. A negative result does not preclude SARS-Cov-2 infection and should not be  used as the sole basis for treatment or other patient management decisions. A negative result may occur with  improper specimen collection/handling, submission of specimen other than nasopharyngeal swab, presence of viral mutation(s) within the areas targeted by this assay, and inadequate number of viral copies(<138 copies/mL). A negative result must be combined with clinical observations, patient history, and epidemiological information. The expected result is Negative.  Fact Sheet for Patients:  BloggerCourse.com  Fact Sheet for Healthcare Providers:  SeriousBroker.it  This test is no t yet approved or cleared by the [place] FDA and  has been authorized for detection and/or diagnosis of SARS-CoV-2 by FDA under an Emergency Use Authorization (EUA). This EUA will remain  in effect (meaning this test can be used) for the duration of the COVID-19 declaration under Section 564(b)(1) of the Act, 21 U.S.C.section 360bbb-3(b)(1), unless the authorization is terminated  or revoked sooner.       Influenza A by PCR NEGATIVE NEGATIVE Final   Influenza B by PCR NEGATIVE NEGATIVE Final    Comment: (NOTE) The Xpert Xpress SARS-CoV-2/FLU/RSV plus assay is intended as an aid in the diagnosis of influenza from Nasopharyngeal swab specimens and should not be used as a sole basis for treatment. Nasal washings and aspirates are unacceptable for Xpert Xpress SARS-CoV-2/FLU/RSV testing.  Fact Sheet for Patients: BloggerCourse.com  Fact Sheet for Healthcare Providers: SeriousBroker.it  This test is not yet approved or cleared by the [place] FDA and has been authorized for detection and/or diagnosis of SARS-CoV-2 by FDA under an Emergency Use Authorization (EUA). This EUA will remain in effect (meaning this test can be used) for the duration of the COVID-19 declaration under Section  564(b)(1) of the Act, 21 U.S.C. section 360bbb-3(b)(1), unless the authorization is terminated or revoked.     Resp Syncytial Virus by PCR NEGATIVE NEGATIVE Final    Comment: (NOTE) Fact Sheet for Patients: BloggerCourse.com  Fact Sheet for Healthcare Providers: SeriousBroker.it  This test is not yet approved or cleared by the [place] FDA and has been authorized for detection and/or diagnosis of SARS-CoV-2 by FDA under an Emergency Use Authorization (EUA). This EUA will remain in effect (meaning this test can be used)  for the duration of the COVID-19 declaration under Section 564(b)(1) of the Act, 21 U.S.C. section 360bbb-3(b)(1), unless the authorization is terminated or revoked.  Performed at [facility], [address], [place], [place] [address]          Radiology Studies: MR ABDOMEN MRCP W WO CONTAST Result Date: 10/12/2023 CLINICAL DATA:  Liver metastasis with pancreatic head mass. EXAM: MRI ABDOMEN WITHOUT AND WITH CONTRAST (INCLUDING MRCP) TECHNIQUE: Multiplanar multisequence MR imaging of the abdomen was performed both before and after the administration of intravenous contrast. Heavily T2-weighted images of the biliary and pancreatic ducts were obtained, and three-dimensional MRCP images were rendered by post processing. CONTRAST:  6mL GADAVIST GADOBUTROL 1 MMOL/ML IV SOLN COMPARISON:  CT 10/11/2023 FINDINGS: Lower chest:  Moderate RIGHT pleural effusion. Hepatobiliary: Multiple round lesions of varying size in LEFT and RIGHT hepatic lobe consistent with hepatic metastasis. Lesion too numerous to count. Gallbladder is distended to 5.3 cm. Proximal common bile duct dilated 12 mm. No intrahepatic duct dilatation. There is a caliber change in the distal common bile duct approximately 19 mm from the ampulla consistent restriction at this level. Pancreas: Pancreatic duct is dilated through the body and  tail. Pancreatic duct is normal the head. Caliber change at the same level as the common bile duct. Duct dilatation to 6 mm in the body and tail. Mass lesion in the head of the pancreas measuring 3 cm on image 29/series 5. mass lesion is hypoenhancing on postcontrast T1 weighted imaging measuring 23 mm on image 68/15) Spleen: Normal spleen. Adrenals/urinary tract: Severe RIGHT hydronephrosis. Caliber change in the proximal ureter just beyond the ureteropelvic junction. No clear lesion identified. Stomach/Bowel: Stomach and limited of the small bowel is unremarkable Vascular/Lymphatic: Abdominal aortic normal caliber. No retroperitoneal periportal lymphadenopathy. Musculoskeletal: No aggressive osseous lesion IMPRESSION: 1. Lesion in the pancreatic head obstructing the pancreatic duct and common bile duct is consistent with primary pancreatic adenocarcinoma. 2. Clear multifocal metastatic disease within LEFT and RIGHT hepatic lobe. 3. Biliary dilatation is primary extrahepatic. The gallbladder is distended consistent with obstruction. 4. Severe RIGHT hydronephrosis secondary obstruction near the RIGHT ureteropelvic junction. Concern for metastatic adenopathy obstructing the ureter. No clear lesion identified. 5. Small RIGHT effusion. Electronically Signed   By: Genevive Bi M.D.   On: 10/12/2023 12:14   MR 3D Recon At Scanner Result Date: 10/12/2023 CLINICAL DATA:  Liver metastasis with pancreatic head mass. EXAM: MRI ABDOMEN WITHOUT AND WITH CONTRAST (INCLUDING MRCP) TECHNIQUE: Multiplanar multisequence MR imaging of the abdomen was performed both before and after the administration of intravenous contrast. Heavily T2-weighted images of the biliary and pancreatic ducts were obtained, and three-dimensional MRCP images were rendered by post processing. CONTRAST:  6mL GADAVIST GADOBUTROL 1 MMOL/ML IV SOLN COMPARISON:  CT 10/11/2023 FINDINGS: Lower chest:  Moderate RIGHT pleural effusion. Hepatobiliary: Multiple  round lesions of varying size in LEFT and RIGHT hepatic lobe consistent with hepatic metastasis. Lesion too numerous to count. Gallbladder is distended to 5.3 cm. Proximal common bile duct dilated 12 mm. No intrahepatic duct dilatation. There is a caliber change in the distal common bile duct approximately 19 mm from the ampulla consistent restriction at this level. Pancreas: Pancreatic duct is dilated through the body and tail. Pancreatic duct is normal the head. Caliber change at the same level as the common bile duct. Duct dilatation to 6 mm in the body and tail. Mass lesion in the head of the pancreas measuring 3 cm on image 29/series 5. mass  lesion is hypoenhancing on postcontrast T1 weighted imaging measuring 23 mm on image 68/15) Spleen: Normal spleen. Adrenals/urinary tract: Severe RIGHT hydronephrosis. Caliber change in the proximal ureter just beyond the ureteropelvic junction. No clear lesion identified. Stomach/Bowel: Stomach and limited of the small bowel is unremarkable Vascular/Lymphatic: Abdominal aortic normal caliber. No retroperitoneal periportal lymphadenopathy. Musculoskeletal: No aggressive osseous lesion IMPRESSION: 1. Lesion in the pancreatic head obstructing the pancreatic duct and common bile duct is consistent with primary pancreatic adenocarcinoma. 2. Clear multifocal metastatic disease within LEFT and RIGHT hepatic lobe. 3. Biliary dilatation is primary extrahepatic. The gallbladder is distended consistent with obstruction. 4. Severe RIGHT hydronephrosis secondary obstruction near the RIGHT ureteropelvic junction. Concern for metastatic adenopathy obstructing the ureter. No clear lesion identified. 5. Small RIGHT effusion. Electronically Signed   By: Genevive Bi M.D.   On: 10/12/2023 12:14   CT Head Wo Contrast Result Date: 10/11/2023 CLINICAL DATA:  Altered mental status EXAM: CT HEAD WITHOUT CONTRAST TECHNIQUE: Contiguous axial images were obtained from the base of the skull  through the vertex without intravenous contrast. RADIATION DOSE REDUCTION: This exam was performed according to the departmental dose-optimization program which includes automated exposure control, adjustment of the mA and/or kV according to patient size and/or use of iterative reconstruction technique. COMPARISON:  04/18/2016 FINDINGS: Brain: No acute intracranial abnormality. Specifically, no hemorrhage, hydrocephalus, mass lesion, acute infarction, or significant intracranial injury. Vascular: No hyperdense vessel or unexpected calcification. Skull: No acute calvarial abnormality. Sinuses/Orbits: No acute findings Other: None IMPRESSION: No acute intracranial abnormality. Electronically Signed   By: Charlett Nose M.D.   On: 10/11/2023 19:48   CT ABDOMEN PELVIS W CONTRAST Result Date: 10/11/2023 CLINICAL DATA:  Abdominal pain, vomiting EXAM: CT ABDOMEN AND PELVIS WITH CONTRAST TECHNIQUE: Multidetector CT imaging of the abdomen and pelvis was performed using the standard protocol following bolus administration of intravenous contrast. RADIATION DOSE REDUCTION: This exam was performed according to the departmental dose-optimization program which includes automated exposure control, adjustment of the mA and/or kV according to patient size and/or use of iterative reconstruction technique. CONTRAST:  OMNIPAQUE IOHEXOL 300 MG/ML  SOLN COMPARISON:  04/29/2014 FINDINGS: Lower chest: Small right pleural effusion. Bibasilar airspace opacities, favor atelectasis. Coronary artery and aortic atherosclerosis. Hepatobiliary: Innumerable low-density lesions throughout the liver most compatible with metastases. Gallbladder is distended with surrounding pericholecystic fluid. Central common bile duct dilated measuring 11 mm, tapers in the pancreatic head. No visible ductal stones. Pancreas: Dilated pancreatic duct measuring up to 5 mm. Irregular solid-appearing low-density mass in the pancreatic head measures 2.2 x 1.8 cm.  This is concerning for pancreatic cancer. Spleen: No focal abnormality.  Normal size. Adrenals/Urinary Tract: Severe right hydronephrosis. Right ureter appears decompressed and difficult to follow. Configuration suggests UPJ obstruction. Multiple bilateral renal cysts which appear benign. No hydronephrosis on the left. Adrenal glands and urinary bladder unremarkable. Stomach/Bowel: Normal appendix. Sigmoid diverticulosis. No active diverticulitis. Stomach and small bowel decompressed, unremarkable. Vascular/Lymphatic: Aortic atherosclerosis. No evidence of aneurysm or adenopathy. Reproductive: Prior hysterectomy.  No adnexal masses. Other: Moderate free fluid in the pelvis and adjacent to the liver and spleen. No free air. Musculoskeletal: No acute bony abnormality or suspicious focal abnormality. Degenerative disc and facet disease throughout the lumbar spine. Grade 1 anterolisthesis at L4-5 related to facet disease. IMPRESSION: 2.2 x 1.8 cm low-density mass in the pancreatic head concerning for pancreatic cancer. Associated biliary and pancreatic ductal dilatation and gallbladder distension. Innumerable low-density lesions throughout the liver compatible with metastases. Right hydronephrosis. Right  ureter is decompressed suggesting UPJ obstruction. This is new since 2015. Sigmoid diverticulosis.  No active diverticulitis. Aortic atherosclerosis. Small right pleural effusion.  Bibasilar atelectasis. Moderate free fluid in the abdomen and pelvis. Electronically Signed   By: Charlett Nose M.D.   On: 10/11/2023 19:47   DG Chest Port 1 View Result Date: 10/11/2023 CLINICAL DATA:  Weakness EXAM: PORTABLE CHEST 1 VIEW COMPARISON:  X-ray 06/14/2022 and older. FINDINGS: Hyperinflation. No consolidation, pneumothorax or effusion. There is some linear opacity in the right lung base likely scar or atelectasis. Normal cardiopericardial silhouette. Calcified aorta. Calcifications in the area of the mitral valve annulus.  Degenerative changes of the spine and shoulders. Elevated humeral head bilaterally, possible full-thickness rotator cuff tears. Overlapping cardiac leads. IMPRESSION: Right lung base scar or atelectasis.  No consolidation. Rotated radiograph. Electronically Signed   By: Karen Kays M.D.   On: 10/11/2023 17:06           LOS: 1 day   Time spent= 35 mins    Miguel Rota, MD [facility]  If 7PM-7AM, please contact night-coverage  10/12/2023, 3:01 PM

## 2023-10-13 ENCOUNTER — Other Ambulatory Visit (HOSPITAL_COMMUNITY): Payer: Self-pay

## 2023-10-13 DIAGNOSIS — R531 Weakness: Secondary | ICD-10-CM | POA: Diagnosis not present

## 2023-10-13 LAB — COMPREHENSIVE METABOLIC PANEL
ALT: 33 U/L (ref 0–44)
AST: 107 U/L — ABNORMAL HIGH (ref 15–41)
Albumin: 2.8 g/dL — ABNORMAL LOW (ref 3.5–5.0)
Alkaline Phosphatase: 146 U/L — ABNORMAL HIGH (ref 38–126)
Anion gap: 9 (ref 5–15)
BUN: 7 mg/dL — ABNORMAL LOW (ref 8–23)
CO2: 21 mmol/L — ABNORMAL LOW (ref 22–32)
Calcium: 8.8 mg/dL — ABNORMAL LOW (ref 8.9–10.3)
Chloride: 103 mmol/L (ref 98–111)
Creatinine, Ser: 0.54 mg/dL (ref 0.44–1.00)
GFR, Estimated: >60 mL/min (ref >60)
Glucose, Bld: 126 mg/dL — ABNORMAL HIGH (ref 70–99)
Potassium: 3.9 mmol/L (ref 3.5–5.1)
Sodium: 133 mmol/L — ABNORMAL LOW (ref 135–145)
Total Bilirubin: 4.9 mg/dL — ABNORMAL HIGH (ref 0.0–1.2)
Total Protein: 6.6 g/dL (ref 6.5–8.1)

## 2023-10-13 LAB — CBC
HCT: 32.7 % — ABNORMAL LOW (ref 36.0–46.0)
Hemoglobin: 10.5 g/dL — ABNORMAL LOW (ref 12.0–15.0)
MCH: 32.5 pg (ref 26.0–34.0)
MCHC: 32.1 g/dL (ref 30.0–36.0)
MCV: 101.2 fL — ABNORMAL HIGH (ref 80.0–100.0)
Platelets: 175 10*3/uL (ref 150–400)
RBC: 3.23 MIL/uL — ABNORMAL LOW (ref 3.87–5.11)
RDW: 15.7 % — ABNORMAL HIGH (ref 11.5–15.5)
WBC: 7.8 10*3/uL (ref 4.0–10.5)
nRBC: 0 % (ref 0.0–0.2)

## 2023-10-13 LAB — MAGNESIUM: Magnesium: 1.8 mg/dL (ref 1.7–2.4)

## 2023-10-13 MED ORDER — HYDROCODONE-ACETAMINOPHEN 5-325 MG PO TABS
1.0000 | ORAL_TABLET | Freq: Four times a day (QID) | ORAL | 0 refills | Status: DC | PRN
  Filled 2023-10-13: qty 30, 8d supply, fill #0

## 2023-10-13 MED ORDER — POLYETHYLENE GLYCOL 3350 17 GM/SCOOP PO POWD
17.0000 g | Freq: Every day | ORAL | 3 refills | Status: DC | PRN
  Filled 2023-10-13: qty 238, 14d supply, fill #0

## 2023-10-13 MED ORDER — DOCUSATE SODIUM 100 MG PO CAPS
100.0000 mg | ORAL_CAPSULE | Freq: Two times a day (BID) | ORAL | 0 refills | Status: DC | PRN
  Filled 2023-10-13: qty 60, 30d supply, fill #0

## 2023-10-13 NOTE — Progress Notes (Signed)
PT Cancellation Note  Patient Details Name: Caroline Ortiz MRN: [id number] DOB: Sep 21, 1932   Cancelled Treatment:     PT will sign off, Patient now  on comfort care measures.  Blanchard Kelch PT Acute Rehabilitation Services Office [phone]    Rada Hay 10/13/2023, 8:28 AM

## 2023-10-13 NOTE — TOC Progression Note (Addendum)
Transition of Care [facility]) - Progression Note    Patient Details  Name: Caroline Ortiz MRN: [id number] Date of Birth: 1932-03-29  [facility]) CM/SW Contact  Beckie Busing, RN Phone Number:[phone]  10/13/2023, 8:57 AM  Clinical Narrative:    [facility] acknowledges consult for patient to go home with hospice. Hospice referral has been made for [facility] as choice per  Cherie with [facility] is aware and following.     Expected Discharge Plan and Services                                               Social Determinants of Health (SDOH) Interventions SDOH Screenings   Food Insecurity: No Food Insecurity (10/11/2023)  Housing: High Risk (10/11/2023)  Transportation Needs: No Transportation Needs (10/12/2023)  Utilities: Not At Risk (10/12/2023)  Tobacco Use: Low Risk  (10/11/2023)    Readmission Risk Interventions     No data to display

## 2023-10-13 NOTE — Plan of Care (Signed)
  Problem: Education: Goal: Knowledge of General Education information will improve Description: Including pain rating scale, medication(s)/side effects and non-pharmacologic comfort measures Outcome: Progressing   Problem: Health Behavior/Discharge Planning: Goal: Ability to manage health-related needs will improve Outcome: Progressing   Problem: Clinical Measurements: Goal: Ability to maintain clinical measurements within normal limits will improve Outcome: Progressing Goal: Will remain free from infection Outcome: Progressing Goal: Respiratory complications will improve Outcome: Progressing Goal: Cardiovascular complication will be avoided Outcome: Progressing   Problem: Activity: Goal: Risk for activity intolerance will decrease Outcome: Progressing   Problem: Nutrition: Goal: Adequate nutrition will be maintained Outcome: Progressing   Problem: Coping: Goal: Level of anxiety will decrease Outcome: Progressing   Problem: Elimination: Goal: Will not experience complications related to bowel motility Outcome: Progressing Goal: Will not experience complications related to urinary retention Outcome: Progressing   Problem: Pain Managment: Goal: General experience of comfort will improve and/or be controlled Outcome: Progressing   Problem: Safety: Goal: Ability to remain free from injury will improve Outcome: Progressing   Problem: Skin Integrity: Goal: Risk for impaired skin integrity will decrease Outcome: Progressing

## 2023-10-13 NOTE — Progress Notes (Signed)
OT Cancellation Note  Patient Details Name: Caroline Ortiz MRN: [id number] DOB: 30-Jul-1932   Cancelled Treatment:    Reason Eval/Treat Not Completed: OT screened, no needs identified, will sign off;Other (comment) (Pt now comfort care per nursing/ family)  Hope Budds 10/13/2023, 8:17 AM

## 2023-10-13 NOTE — TOC Initial Note (Signed)
Transition of Care [facility]) - Initial/Assessment Note    Patient Details  Name: Caroline Ortiz MRN: [id number] Date of Birth: 1931-11-02  Transition of Care [facility]) CM/SW Contact:    Beckie Busing, RN Phone Number:[phone]  10/13/2023, 9:50 AM  Clinical Narrative:                 TOC following patient with hospice referral . Referral has been initiated with [facility] [place]. CM at bedside to discuss disposition plan. Daughter states that the family is prepared to provide 24/7 care and would like to discuss Hospice role with the hospice liaison. Daughter states that she is int he process of calling Cherie with [facility] [place] at this time. TOC will continue to follow for assistance with disposition needs.     Barriers to Discharge: Continued Medical Work up   Patient Goals and CMS Choice            Expected Discharge Plan and Services         Expected Discharge Date: 10/13/23                                    Prior Living Arrangements/Services                       Activities of Daily Living   ADL Screening (condition at time of admission) Independently performs ADLs?: No Does the patient have a NEW difficulty with bathing/dressing/toileting/self-feeding that is expected to last >3 days?: No Does the patient have a NEW difficulty with getting in/out of bed, walking, or climbing stairs that is expected to last >3 days?: No Does the patient have a NEW difficulty with communication that is expected to last >3 days?: No Is the patient deaf or have difficulty hearing?: No Does the patient have difficulty seeing, even when wearing glasses/contacts?: No Does the patient have difficulty concentrating, remembering, or making decisions?: No  Permission Sought/Granted                  Emotional Assessment              Admission diagnosis:  Weakness [R53.1] Jaundice [R17] Pancreatic mass [K86.89] Generalized weakness  [R53.1] Patient Active Problem List   Diagnosis Date Noted   Generalized weakness 10/11/2023   Pancreatic mass 10/11/2023   Failure to thrive in adult 10/11/2023   Obstructive jaundice 10/11/2023   Primary osteoarthritis of left hip 05/16/2019   Systolic anterior movement of mitral valve 11/14/2018   Paroxysmal SVT (supraventricular tachycardia) (HCC) 11/14/2018   Essential hypertension 11/14/2018   Palpitations 11/14/2018   Preop cardiovascular exam 11/14/2018   PCP:  Garlan Fillers, MD Pharmacy:   [facility] [phone] Ginette Otto, [place] - [address] AT [facility] [address] [place] [address] Phone: [phone] Fax: [phone]  [place] [facility] [address] [place] [address] Phone: [phone] Fax: [phone]     Social Drivers of Health (SDOH) Social History: SDOH Screenings   Food Insecurity: No Food Insecurity (10/11/2023)  Housing: High Risk (10/11/2023)  Transportation Needs: No Transportation Needs (10/12/2023)  Utilities: Not At Risk (10/12/2023)  Tobacco Use: Low Risk  (10/11/2023)   SDOH Interventions:     Readmission Risk Interventions     No data to display

## 2023-10-13 NOTE — Discharge Summary (Signed)
Physician Discharge Summary  Caroline Ortiz AOZ:[id number] DOB: [birth year] DOA: 10/11/2023  PCP: Garlan Fillers, MD  Admit date: 10/11/2023 Discharge date: 10/13/2023  Admitted From: Home Disposition: Home hospice  Recommendations for Outpatient Follow-up:  Home with hospice   Discharge Condition: Stable CODE STATUS: DNR Diet recommendation: Low-salt  Brief/Interim Summary: Brief Narrative:  89 year old with history of hepatitis C, CHF, HTN, chronic hip pain, osteoarthritis, multiple falls comes to the hospital for generalized failure to thrive, poor oral intake and weakness.  Upon admission CT scan of abdomen pelvis showed concerns of pancreatic head mass possibly consistent with pancreatic cancer.  Eventually MRI showed what appears to be primary metastatic pancreatic cancer.  Discussed case with patient's family.  Family opting to transition patient home with hospice.   Assessment & Plan:  Principal Problem:   Generalized weakness Active Problems:   Essential hypertension   Primary osteoarthritis of left hip   Pancreatic mass   Failure to thrive in adult   Obstructive jaundice     Pancreatic mass with obstructive jaundice. Right-sided hydronephrosis. Elevated total bilirubin as expected.  CT abdomen pelvis shows pancreatic mass with likely metastases.  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  After prolonged discussion with the patient and family they have opted to transition patient to comfort care/hospice.   Hyponatremia/hypokalemia Dehydration Due to dehydration getting IV fluids Replete electrolytes   . History of hepatitis C  Overall stable.   Left hip pain from osteoarthritis.   Multiple falls in the past. none   Decreased p.o. intake, generalized weakness, failure to thrive, significant weight loss.. Nutrition consult   History of congestive heart failure.  Preserved EF 70%     Hypertension Will DC antihypertensives  Life expectancy weeks.  Likely less than 3-6 months    DVT prophylaxis: enoxaparin (LOVENOX) injection 40 mg Start: 10/11/23 2200    Code Status: Limited: Do not attempt resuscitation (DNR) -DNR-LIMITED -Do Not Intubate/DNI  Family Communication: Met with family at bedside Status is: Inpatient Remains inpatient appropriate because: Discharge home with hospice  Subjective:  Met with the patient and daughter at bedside.  Will transition patient home with hospice  Examination:  General exam: Appears calm and comfortable, cachectic frail with bilateral temporal wasting. Respiratory system: Clear to auscultation. Respiratory effort normal. Cardiovascular system: S1 & S2 heard, RRR. No JVD, murmurs, rubs, gallops or clicks. No pedal edema. Gastrointestinal system: Abdomen is nondistended, soft and nontender. No organomegaly or masses felt. Normal bowel sounds heard. Central nervous system: Alert and oriented. No focal neurological deficits. Extremities: Symmetric 5 x 5 power. Skin: No rashes, lesions or ulcers Psychiatry: Judgement and insight appear normal. Mood & affect appropriate.    Discharge Diagnoses:  Principal Problem:   Generalized weakness Active Problems:   Essential hypertension   Primary osteoarthritis of left hip   Pancreatic mass   Failure to thrive in adult   Obstructive jaundice      Discharge Exam: Vitals:   10/12/23 2005 10/13/23 0534  BP: (!) 153/92 (!) 149/85  Pulse: 84 83  Resp: 17 14  Temp: 98.5 F (36.9 C) 98.2 F (36.8 C)  SpO2: 100% 99%   Vitals:   10/12/23 0940 10/12/23 1411 10/12/23 2005 10/13/23 0534  BP: (!) 142/75 (!) 158/82 (!) 153/92 (!) 149/85  Pulse: 80 79 84 83  Resp: 16 16 17 14   Temp: 97.6 F (36.4 C) 98 F (36.7 C) 98.5 F (36.9 C) 98.2 F (36.8  C)  TempSrc: Oral Oral Oral Oral  SpO2: 100% 100% 100% 99%  Height:          Discharge Instructions  Discharge Instructions      meds to beds pharmacy consult ([facility] ONLY)   Complete by: As directed       Allergies as of 10/13/2023       Reactions   Penicillins Hives, Swelling, Rash, Other (See Comments)   DID THE REACTION INVOLVE: Swelling of the face/tongue/throat, SOB, or low BP? Yes, Sudden or severe rash/hives, skin peeling, or the inside of the mouth or nose? no, Did it require medical treatment? No, When did it last happen?    younger  , If all above answers are "NO", may proceed with cephalosporin use.        Medication List     STOP taking these medications    amLODipine 2.5 MG tablet Commonly known as: NORVASC   lisinopril 10 MG tablet Commonly known as: ZESTRIL   methocarbamol 500 MG tablet Commonly known as: ROBAXIN   metoprolol tartrate 50 MG tablet Commonly known as: LOPRESSOR       TAKE these medications    ALPRAZolam 0.25 MG tablet Commonly known as: XANAX Take 0.25 mg by mouth 2 (two) times daily as needed for anxiety or sleep.   cyclobenzaprine 5 MG tablet Commonly known as: FLEXERIL Take 5 mg by mouth 3 (three) times daily as needed for muscle spasms.   docusate sodium 100 MG capsule Commonly known as: COLACE Take 1 capsule (100 mg total) by mouth 2 (two) times daily as needed for mild constipation.   gabapentin 300 MG capsule Commonly known as: NEURONTIN Take 300 mg by mouth 3 (three) times daily.   HYDROcodone-acetaminophen 5-325 MG tablet Commonly known as: NORCO/VICODIN Take 1 tablet by mouth every 6 (six) hours as needed for moderate pain (pain score 4-6) or severe pain (pain score 7-10).   polyethylene glycol powder 17 GM/SCOOP powder Commonly known as: GLYCOLAX/MIRALAX Mix 17 g in 4 oz of fluid & take by mouth daily as needed for moderate constipation or severe constipation.   traMADol 50 MG tablet Commonly known as: ULTRAM Take 1 tablet (50 mg total) by mouth 2 (two) times daily as needed. What changed:  how much to take when to take  this reasons to take this               Durable Medical Equipment  (From admission, onward)           Start     Ordered   10/12/23 1457  For home use only DME Hospital bed  Once       Question Answer Comment  Length of Need Lifetime   Bed type Semi-electric      10/12/23 1457            Follow-up Information     Garlan Fillers, MD Follow up in 1 week(s).   Specialty: Internal Medicine Contact information: [address] [place] [address] [phone]                Allergies  Allergen Reactions   Penicillins Hives, Swelling, Rash and Other (See Comments)    DID THE REACTION INVOLVE: Swelling of the face/tongue/throat, SOB, or low BP? Yes, Sudden or severe rash/hives, skin peeling, or the inside of the mouth or nose? no, Did it require medical treatment? No, When did it last happen?    younger  , If all above answers  are "NO", may proceed with cephalosporin use.    You were cared for by a hospitalist during your hospital stay. If you have any questions about your discharge medications or the care you received while you were in the hospital after you are discharged, you can call the unit and asked to speak with the hospitalist on call if the hospitalist that took care of you is not available. Once you are discharged, your primary care physician will handle any further medical issues. Please note that no refills for any discharge medications will be authorized once you are discharged, as it is imperative that you return to your primary care physician (or establish a relationship with a primary care physician if you do not have one) for your aftercare needs so that they can reassess your need for medications and monitor your lab values.  You were cared for by a hospitalist during your hospital stay. If you have any questions about your discharge medications or the care you received while you were in the hospital after you are discharged, you can call the  unit and asked to speak with the hospitalist on call if the hospitalist that took care of you is not available. Once you are discharged, your primary care physician will handle any further medical issues. Please note that NO REFILLS for any discharge medications will be authorized once you are discharged, as it is imperative that you return to your primary care physician (or establish a relationship with a primary care physician if you do not have one) for your aftercare needs so that they can reassess your need for medications and monitor your lab values.  Please request your Prim.MD to go over all Hospital Tests and Procedure/Radiological results at the follow up, please get all Hospital records sent to your Prim MD by signing hospital release before you go home.  Get CBC, CMP, 2 view Chest X ray checked  by Primary MD during your next visit or SNF MD in 5-7 days ( we routinely change or add medications that can affect your baseline labs and fluid status, therefore we recommend that you get the mentioned basic workup next visit with your PCP, your PCP may decide not to get them or add new tests based on their clinical decision)  On your next visit with your primary care physician please Get Medicines reviewed and adjusted.  If you experience worsening of your admission symptoms, develop shortness of breath, life threatening emergency, suicidal or homicidal thoughts you must seek medical attention immediately by calling 911 or calling your MD immediately  if symptoms less severe.  You Must read complete instructions/literature along with all the possible adverse reactions/side effects for all the Medicines you take and that have been prescribed to you. Take any new Medicines after you have completely understood and accpet all the possible adverse reactions/side effects.   Do not drive, operate heavy machinery, perform activities at heights, swimming or participation in water activities or provide baby  sitting services if your were admitted for syncope or siezures until you have seen by Primary MD or a Neurologist and advised to do so again.  Do not drive when taking Pain medications.   Procedures/Studies: MR ABDOMEN MRCP W WO CONTAST Result Date: 10/12/2023 CLINICAL DATA:  Liver metastasis with pancreatic head mass. EXAM: MRI ABDOMEN WITHOUT AND WITH CONTRAST (INCLUDING MRCP) TECHNIQUE: Multiplanar multisequence MR imaging of the abdomen was performed both before and after the administration of intravenous contrast. Heavily T2-weighted images of the biliary  and pancreatic ducts were obtained, and three-dimensional MRCP images were rendered by post processing. CONTRAST:  6mL GADAVIST GADOBUTROL 1 MMOL/ML IV SOLN COMPARISON:  CT 10/11/2023 FINDINGS: Lower chest:  Moderate RIGHT pleural effusion. Hepatobiliary: Multiple round lesions of varying size in LEFT and RIGHT hepatic lobe consistent with hepatic metastasis. Lesion too numerous to count. Gallbladder is distended to 5.3 cm. Proximal common bile duct dilated 12 mm. No intrahepatic duct dilatation. There is a caliber change in the distal common bile duct approximately 19 mm from the ampulla consistent restriction at this level. Pancreas: Pancreatic duct is dilated through the body and tail. Pancreatic duct is normal the head. Caliber change at the same level as the common bile duct. Duct dilatation to 6 mm in the body and tail. Mass lesion in the head of the pancreas measuring 3 cm on image 29/series 5. mass lesion is hypoenhancing on postcontrast T1 weighted imaging measuring 23 mm on image 68/15) Spleen: Normal spleen. Adrenals/urinary tract: Severe RIGHT hydronephrosis. Caliber change in the proximal ureter just beyond the ureteropelvic junction. No clear lesion identified. Stomach/Bowel: Stomach and limited of the small bowel is unremarkable Vascular/Lymphatic: Abdominal aortic normal caliber. No retroperitoneal periportal lymphadenopathy.  Musculoskeletal: No aggressive osseous lesion IMPRESSION: 1. Lesion in the pancreatic head obstructing the pancreatic duct and common bile duct is consistent with primary pancreatic adenocarcinoma. 2. Clear multifocal metastatic disease within LEFT and RIGHT hepatic lobe. 3. Biliary dilatation is primary extrahepatic. The gallbladder is distended consistent with obstruction. 4. Severe RIGHT hydronephrosis secondary obstruction near the RIGHT ureteropelvic junction. Concern for metastatic adenopathy obstructing the ureter. No clear lesion identified. 5. Small RIGHT effusion. Electronically Signed   By: Genevive Bi M.D.   On: 10/12/2023 12:14   MR 3D Recon At Scanner Result Date: 10/12/2023 CLINICAL DATA:  Liver metastasis with pancreatic head mass. EXAM: MRI ABDOMEN WITHOUT AND WITH CONTRAST (INCLUDING MRCP) TECHNIQUE: Multiplanar multisequence MR imaging of the abdomen was performed both before and after the administration of intravenous contrast. Heavily T2-weighted images of the biliary and pancreatic ducts were obtained, and three-dimensional MRCP images were rendered by post processing. CONTRAST:  6mL GADAVIST GADOBUTROL 1 MMOL/ML IV SOLN COMPARISON:  CT 10/11/2023 FINDINGS: Lower chest:  Moderate RIGHT pleural effusion. Hepatobiliary: Multiple round lesions of varying size in LEFT and RIGHT hepatic lobe consistent with hepatic metastasis. Lesion too numerous to count. Gallbladder is distended to 5.3 cm. Proximal common bile duct dilated 12 mm. No intrahepatic duct dilatation. There is a caliber change in the distal common bile duct approximately 19 mm from the ampulla consistent restriction at this level. Pancreas: Pancreatic duct is dilated through the body and tail. Pancreatic duct is normal the head. Caliber change at the same level as the common bile duct. Duct dilatation to 6 mm in the body and tail. Mass lesion in the head of the pancreas measuring 3 cm on image 29/series 5. mass lesion is  hypoenhancing on postcontrast T1 weighted imaging measuring 23 mm on image 68/15) Spleen: Normal spleen. Adrenals/urinary tract: Severe RIGHT hydronephrosis. Caliber change in the proximal ureter just beyond the ureteropelvic junction. No clear lesion identified. Stomach/Bowel: Stomach and limited of the small bowel is unremarkable Vascular/Lymphatic: Abdominal aortic normal caliber. No retroperitoneal periportal lymphadenopathy. Musculoskeletal: No aggressive osseous lesion IMPRESSION: 1. Lesion in the pancreatic head obstructing the pancreatic duct and common bile duct is consistent with primary pancreatic adenocarcinoma. 2. Clear multifocal metastatic disease within LEFT and RIGHT hepatic lobe. 3. Biliary dilatation is primary extrahepatic. The gallbladder  is distended consistent with obstruction. 4. Severe RIGHT hydronephrosis secondary obstruction near the RIGHT ureteropelvic junction. Concern for metastatic adenopathy obstructing the ureter. No clear lesion identified. 5. Small RIGHT effusion. Electronically Signed   By: Genevive Bi M.D.   On: 10/12/2023 12:14   CT Head Wo Contrast Result Date: 10/11/2023 CLINICAL DATA:  Altered mental status EXAM: CT HEAD WITHOUT CONTRAST TECHNIQUE: Contiguous axial images were obtained from the base of the skull through the vertex without intravenous contrast. RADIATION DOSE REDUCTION: This exam was performed according to the departmental dose-optimization program which includes automated exposure control, adjustment of the mA and/or kV according to patient size and/or use of iterative reconstruction technique. COMPARISON:  04/18/2016 FINDINGS: Brain: No acute intracranial abnormality. Specifically, no hemorrhage, hydrocephalus, mass lesion, acute infarction, or significant intracranial injury. Vascular: No hyperdense vessel or unexpected calcification. Skull: No acute calvarial abnormality. Sinuses/Orbits: No acute findings Other: None IMPRESSION: No acute  intracranial abnormality. Electronically Signed   By: Charlett Nose M.D.   On: 10/11/2023 19:48   CT ABDOMEN PELVIS W CONTRAST Result Date: 10/11/2023 CLINICAL DATA:  Abdominal pain, vomiting EXAM: CT ABDOMEN AND PELVIS WITH CONTRAST TECHNIQUE: Multidetector CT imaging of the abdomen and pelvis was performed using the standard protocol following bolus administration of intravenous contrast. RADIATION DOSE REDUCTION: This exam was performed according to the departmental dose-optimization program which includes automated exposure control, adjustment of the mA and/or kV according to patient size and/or use of iterative reconstruction technique. CONTRAST:  OMNIPAQUE IOHEXOL 300 MG/ML  SOLN COMPARISON:  04/29/2014 FINDINGS: Lower chest: Small right pleural effusion. Bibasilar airspace opacities, favor atelectasis. Coronary artery and aortic atherosclerosis. Hepatobiliary: Innumerable low-density lesions throughout the liver most compatible with metastases. Gallbladder is distended with surrounding pericholecystic fluid. Central common bile duct dilated measuring 11 mm, tapers in the pancreatic head. No visible ductal stones. Pancreas: Dilated pancreatic duct measuring up to 5 mm. Irregular solid-appearing low-density mass in the pancreatic head measures 2.2 x 1.8 cm. This is concerning for pancreatic cancer. Spleen: No focal abnormality.  Normal size. Adrenals/Urinary Tract: Severe right hydronephrosis. Right ureter appears decompressed and difficult to follow. Configuration suggests UPJ obstruction. Multiple bilateral renal cysts which appear benign. No hydronephrosis on the left. Adrenal glands and urinary bladder unremarkable. Stomach/Bowel: Normal appendix. Sigmoid diverticulosis. No active diverticulitis. Stomach and small bowel decompressed, unremarkable. Vascular/Lymphatic: Aortic atherosclerosis. No evidence of aneurysm or adenopathy. Reproductive: Prior hysterectomy.  No adnexal masses. Other: Moderate  free fluid in the pelvis and adjacent to the liver and spleen. No free air. Musculoskeletal: No acute bony abnormality or suspicious focal abnormality. Degenerative disc and facet disease throughout the lumbar spine. Grade 1 anterolisthesis at L4-5 related to facet disease. IMPRESSION: 2.2 x 1.8 cm low-density mass in the pancreatic head concerning for pancreatic cancer. Associated biliary and pancreatic ductal dilatation and gallbladder distension. Innumerable low-density lesions throughout the liver compatible with metastases. Right hydronephrosis. Right ureter is decompressed suggesting UPJ obstruction. This is new since 2015. Sigmoid diverticulosis.  No active diverticulitis. Aortic atherosclerosis. Small right pleural effusion.  Bibasilar atelectasis. Moderate free fluid in the abdomen and pelvis. Electronically Signed   By: Charlett Nose M.D.   On: 10/11/2023 19:47   DG Chest Port 1 View Result Date: 10/11/2023 CLINICAL DATA:  Weakness EXAM: PORTABLE CHEST 1 VIEW COMPARISON:  X-ray 06/14/2022 and older. FINDINGS: Hyperinflation. No consolidation, pneumothorax or effusion. There is some linear opacity in the right lung base likely scar or atelectasis. Normal cardiopericardial silhouette. Calcified aorta. Calcifications in  the area of the mitral valve annulus. Degenerative changes of the spine and shoulders. Elevated humeral head bilaterally, possible full-thickness rotator cuff tears. Overlapping cardiac leads. IMPRESSION: Right lung base scar or atelectasis.  No consolidation. Rotated radiograph. Electronically Signed   By: Karen Kays M.D.   On: 10/11/2023 17:06     The results of significant diagnostics from this hospitalization (including imaging, microbiology, ancillary and laboratory) are listed below for reference.     Microbiology: Recent Results (from the past 240 hours)  Culture, blood (routine x 2)     Status: None (Preliminary result)   Collection Time: 10/11/23  4:36 PM   Specimen:  BLOOD  Result Value Ref Range Status   Specimen Description   Final    BLOOD RIGHT ANTECUBITAL Performed at [facility], [address], [place], [place] [address]    Special Requests   Final    BOTTLES DRAWN AEROBIC AND ANAEROBIC Blood Culture results may not be optimal due to an inadequate volume of blood received in culture bottles Performed at [facility], [address], [place], [place] [address]    Culture   Final    NO GROWTH 2 DAYS Performed at [facility], [address], [place], [place] [address]    Report Status PENDING  Incomplete  Culture, blood (routine x 2)     Status: None (Preliminary result)   Collection Time: 10/11/23  4:36 PM   Specimen: BLOOD  Result Value Ref Range Status   Specimen Description   Final    BLOOD LEFT ANTECUBITAL Performed at [facility], [address], [place], [place] [address]    Special Requests   Final    BOTTLES DRAWN AEROBIC AND ANAEROBIC Blood Culture adequate volume   Culture   Final    NO GROWTH 2 DAYS Performed at [facility], [address] [place], [place] [address]    Report Status PENDING  Incomplete  Resp panel by RT-PCR (RSV, Flu A&B, Covid) Anterior Nasal Swab     Status: None   Collection Time: 10/11/23  4:42 PM   Specimen: Anterior Nasal Swab  Result Value Ref Range Status   SARS Coronavirus 2 by RT PCR NEGATIVE NEGATIVE Final    Comment: (NOTE) SARS-CoV-2 target nucleic acids are NOT DETECTED.  The SARS-CoV-2 RNA is generally detectable in upper respiratory specimens during the acute phase of infection. The lowest concentration of SARS-CoV-2 viral copies this assay can detect is 138 copies/mL. A negative result does not preclude SARS-Cov-2 infection and should not be used as the sole basis for treatment or other patient management decisions. A negative result may occur with  improper specimen collection/handling, submission of specimen  other than nasopharyngeal swab, presence of viral mutation(s) within the areas targeted by this assay, and inadequate number of viral copies(<138 copies/mL). A negative result must be combined with clinical observations, patient history, and epidemiological information. The expected result is Negative.  Fact Sheet for Patients:  BloggerCourse.com  Fact Sheet for Healthcare Providers:  SeriousBroker.it  This test is no t yet approved or cleared by the [place] FDA and  has been authorized for detection and/or diagnosis of SARS-CoV-2 by FDA under an Emergency Use Authorization (EUA). This EUA will remain  in effect (meaning this test can be used) for the duration of the COVID-19 declaration under Section 564(b)(1) of the Act, 21 U.S.C.section 360bbb-3(b)(1), unless the authorization is terminated  or revoked sooner.  Influenza A by PCR NEGATIVE NEGATIVE Final   Influenza B by PCR NEGATIVE NEGATIVE Final    Comment: (NOTE) The Xpert Xpress SARS-CoV-2/FLU/RSV plus assay is intended as an aid in the diagnosis of influenza from Nasopharyngeal swab specimens and should not be used as a sole basis for treatment. Nasal washings and aspirates are unacceptable for Xpert Xpress SARS-CoV-2/FLU/RSV testing.  Fact Sheet for Patients: BloggerCourse.com  Fact Sheet for Healthcare Providers: SeriousBroker.it  This test is not yet approved or cleared by the [place] FDA and has been authorized for detection and/or diagnosis of SARS-CoV-2 by FDA under an Emergency Use Authorization (EUA). This EUA will remain in effect (meaning this test can be used) for the duration of the COVID-19 declaration under Section 564(b)(1) of the Act, 21 U.S.C. section 360bbb-3(b)(1), unless the authorization is terminated or revoked.     Resp Syncytial Virus by PCR NEGATIVE NEGATIVE Final     Comment: (NOTE) Fact Sheet for Patients: BloggerCourse.com  Fact Sheet for Healthcare Providers: SeriousBroker.it  This test is not yet approved or cleared by the [place] FDA and has been authorized for detection and/or diagnosis of SARS-CoV-2 by FDA under an Emergency Use Authorization (EUA). This EUA will remain in effect (meaning this test can be used) for the duration of the COVID-19 declaration under Section 564(b)(1) of the Act, 21 U.S.C. section 360bbb-3(b)(1), unless the authorization is terminated or revoked.  Performed at [facility], [address] [place], [place] [address]      Labs: BNP (last 3 results) No results for input(s): "BNP" in the last 8760 hours. Basic Metabolic Panel: Recent Labs  Lab 10/11/23 1636 10/12/23 0537 10/13/23 0544  NA 130* 132* 133*  K 3.1* 3.3* 3.9  CL 93* 103 103  CO2 25 22 21*  GLUCOSE 106* 116* 126*  BUN 9 9 7*  CREATININE 0.67 0.60 0.54  CALCIUM 8.7* 8.3* 8.8*  MG  --  1.7 1.8  PHOS  --  2.8  --    Liver Function Tests: Recent Labs  Lab 10/11/23 1636 10/12/23 0537 10/13/23 0544  AST 115* 95* 107*  ALT 34 29 33  ALKPHOS 150* 123 146*  BILITOT 4.5* 4.1* 4.9*  PROT 6.8 5.9* 6.6  ALBUMIN 2.8* 2.4* 2.8*   Recent Labs  Lab 10/11/23 1636  LIPASE 340*   Recent Labs  Lab 10/11/23 1636  AMMONIA 18   CBC: Recent Labs  Lab 10/11/23 1636 10/12/23 0537 10/13/23 0544  WBC 8.0 6.4 7.8  NEUTROABS 6.5  --   --   HGB 10.6* 9.7* 10.5*  HCT 31.7* 29.4* 32.7*  MCV 96.4 97.4 101.2*  PLT 185 163 175   Cardiac Enzymes: No results for input(s): "CKTOTAL", "CKMB", "CKMBINDEX", "TROPONINI" in the last 168 hours. BNP: Invalid input(s): "POCBNP" CBG: Recent Labs  Lab 10/11/23 1608  GLUCAP 89   D-Dimer No results for input(s): "DDIMER" in the last 72 hours. Hgb A1c Recent Labs    10/12/23 0537  HGBA1C 5.3   Lipid Profile No results for  input(s): "CHOL", "HDL", "LDLCALC", "TRIG", "CHOLHDL", "LDLDIRECT" in the last 72 hours. Thyroid function studies No results for input(s): "TSH", "T4TOTAL", "T3FREE", "THYROIDAB" in the last 72 hours.  Invalid input(s): "FREET3" Anemia work up No results for input(s): "VITAMINB12", "FOLATE", "FERRITIN", "TIBC", "IRON", "RETICCTPCT" in the last 72 hours. Urinalysis    Component Value Date/Time   COLORURINE AMBER (A) 10/12/2023 0822   APPEARANCEUR CLEAR 10/12/2023 0822   LABSPEC 1.026 10/12/2023 6045  PHURINE 6.0 10/12/2023 0822   GLUCOSEU NEGATIVE 10/12/2023 0822   HGBUR NEGATIVE 10/12/2023 0822   BILIRUBINUR NEGATIVE 10/12/2023 0822   KETONESUR NEGATIVE 10/12/2023 0822   PROTEINUR 30 (A) 10/12/2023 0822   UROBILINOGEN 1.0 04/29/2014 0443   NITRITE NEGATIVE 10/12/2023 0822   LEUKOCYTESUR NEGATIVE 10/12/2023 0822   Sepsis Labs Recent Labs  Lab 10/11/23 1636 10/12/23 0537 10/13/23 0544  WBC 8.0 6.4 7.8   Microbiology Recent Results (from the past 240 hours)  Culture, blood (routine x 2)     Status: None (Preliminary result)   Collection Time: 10/11/23  4:36 PM   Specimen: BLOOD  Result Value Ref Range Status   Specimen Description   Final    BLOOD RIGHT ANTECUBITAL Performed at [facility], [address], [place], [place] [address]    Special Requests   Final    BOTTLES DRAWN AEROBIC AND ANAEROBIC Blood Culture results may not be optimal due to an inadequate volume of blood received in culture bottles Performed at [facility], [address], [place], [place] [address]    Culture   Final    NO GROWTH 2 DAYS Performed at [facility], [address] [place], [place] [address]    Report Status PENDING  Incomplete  Culture, blood (routine x 2)     Status: None (Preliminary result)   Collection Time: 10/11/23  4:36 PM   Specimen: BLOOD  Result Value Ref Range Status   Specimen Description   Final    BLOOD LEFT  ANTECUBITAL Performed at [facility], [address], [place], [place] [address]    Special Requests   Final    BOTTLES DRAWN AEROBIC AND ANAEROBIC Blood Culture adequate volume   Culture   Final    NO GROWTH 2 DAYS Performed at [facility], [address] [place], [place] [address]    Report Status PENDING  Incomplete  Resp panel by RT-PCR (RSV, Flu A&B, Covid) Anterior Nasal Swab     Status: None   Collection Time: 10/11/23  4:42 PM   Specimen: Anterior Nasal Swab  Result Value Ref Range Status   SARS Coronavirus 2 by RT PCR NEGATIVE NEGATIVE Final    Comment: (NOTE) SARS-CoV-2 target nucleic acids are NOT DETECTED.  The SARS-CoV-2 RNA is generally detectable in upper respiratory specimens during the acute phase of infection. The lowest concentration of SARS-CoV-2 viral copies this assay can detect is 138 copies/mL. A negative result does not preclude SARS-Cov-2 infection and should not be used as the sole basis for treatment or other patient management decisions. A negative result may occur with  improper specimen collection/handling, submission of specimen other than nasopharyngeal swab, presence of viral mutation(s) within the areas targeted by this assay, and inadequate number of viral copies(<138 copies/mL). A negative result must be combined with clinical observations, patient history, and epidemiological information. The expected result is Negative.  Fact Sheet for Patients:  BloggerCourse.com  Fact Sheet for Healthcare Providers:  SeriousBroker.it  This test is no t yet approved or cleared by the [place] FDA and  has been authorized for detection and/or diagnosis of SARS-CoV-2 by FDA under an Emergency Use Authorization (EUA). This EUA will remain  in effect (meaning this test can be used) for the duration of the COVID-19 declaration under Section 564(b)(1) of the Act,  21 U.S.C.section 360bbb-3(b)(1), unless the authorization is terminated  or revoked sooner.       Influenza  A by PCR NEGATIVE NEGATIVE Final   Influenza B by PCR NEGATIVE NEGATIVE Final    Comment: (NOTE) The Xpert Xpress SARS-CoV-2/FLU/RSV plus assay is intended as an aid in the diagnosis of influenza from Nasopharyngeal swab specimens and should not be used as a sole basis for treatment. Nasal washings and aspirates are unacceptable for Xpert Xpress SARS-CoV-2/FLU/RSV testing.  Fact Sheet for Patients: BloggerCourse.com  Fact Sheet for Healthcare Providers: SeriousBroker.it  This test is not yet approved or cleared by the [place] FDA and has been authorized for detection and/or diagnosis of SARS-CoV-2 by FDA under an Emergency Use Authorization (EUA). This EUA will remain in effect (meaning this test can be used) for the duration of the COVID-19 declaration under Section 564(b)(1) of the Act, 21 U.S.C. section 360bbb-3(b)(1), unless the authorization is terminated or revoked.     Resp Syncytial Virus by PCR NEGATIVE NEGATIVE Final    Comment: (NOTE) Fact Sheet for Patients: BloggerCourse.com  Fact Sheet for Healthcare Providers: SeriousBroker.it  This test is not yet approved or cleared by the [place] FDA and has been authorized for detection and/or diagnosis of SARS-CoV-2 by FDA under an Emergency Use Authorization (EUA). This EUA will remain in effect (meaning this test can be used) for the duration of the COVID-19 declaration under Section 564(b)(1) of the Act, 21 U.S.C. section 360bbb-3(b)(1), unless the authorization is terminated or revoked.  Performed at [facility], [address], [place], [place] [address]      Time coordinating discharge:  I have spent 35 minutes face to face with the patient and on the ward discussing the  patients care, assessment, plan and disposition with other care givers. >50% of the time was devoted counseling the patient about the risks and benefits of treatment/Discharge disposition and coordinating care.   SIGNED:   Miguel Rota, MD  [facility] 10/13/2023, 11:15 AM   If 7PM-7AM, please contact night-coverage

## 2023-10-13 NOTE — Progress Notes (Signed)
   10/13/23 0945  TOC Brief Assessment  Insurance and Status Reviewed  Patient has primary care physician Yes  Home environment has been reviewed Home with daughter  Prior level of function: Independet with some assit  Prior/Current Home Services No current home services  Social Drivers of Health Review SDOH reviewed no interventions necessary  Readmission risk has been reviewed Yes  Transition of care needs transition of care needs identified, TOC will continue to follow   TOC following for home with hospice referral.

## 2023-10-13 NOTE — Progress Notes (Signed)
   This pt was referred to hospice services. We have reviewed chart and she has ben approved for hospice care at home. Equipment has been ordered for delivery- hospital bed, OBT, BSC and transport wheelchair. She will need to go home by ambulance. We do have availability to enroll her to hospice care this afternoon once she is home.   Norm Parcel RN [phone]

## 2023-10-13 NOTE — Care Management Important Message (Signed)
Important Message  Patient Details IM not given due to Hospice Name: MORRIGAN ASTI MRN: [id number] Date of Birth: January 15, 1932   Important Message Given:  No     Caren Macadam 10/13/2023, 10:10 AM

## 2023-10-13 NOTE — TOC Transition Note (Signed)
Transition of Care [facility]) - Discharge Note   Patient Details  Name: CORAL RAIS MRN: [id number] Date of Birth: 05-02-1932  Transition of [facility]) CM/SW Contact:  Beckie Busing, RN Phone [phone]  10/13/2023, 11:31 AM   Clinical Narrative:    Patient with discharge orders. Plan is for home with hospice. Family to provide 24/7 care with hospice support. Daughter is at bedside and has verified address for transport. Transportation has been arranged per [facility]AR. D/c packet is at nurses station. No other TOC needs.    Final next level of care: Home w Hospice Care Barriers to Discharge: No Barriers Identified   Patient Goals and CMS Choice            Discharge Placement                       Discharge Plan and Services Additional resources added to the After Visit Summary for                  DME Arranged: Other see comment (per Hospice hospital bed) DME Agency: NA       HH Arranged: NA HH Agency: NA        Social Drivers of Health (SDOH) Interventions SDOH Screenings   Food Insecurity: No Food Insecurity (10/11/2023)  Housing: High Risk (10/11/2023)  Transportation Needs: No Transportation Needs (10/12/2023)  Utilities: Not At Risk (10/12/2023)  Tobacco Use: Low Risk  (10/11/2023)     Readmission Risk Interventions     No data to display

## 2023-10-16 LAB — CULTURE, BLOOD (ROUTINE X 2)
Culture: NO GROWTH
Culture: NO GROWTH
Special Requests: ADEQUATE

## 2023-11-20 DEATH — deceased
